# Patient Record
Sex: Male | Born: 1960 | Hispanic: Yes | Marital: Married | Smoking: Never smoker
Health system: Southern US, Community
[De-identification: ages and names within clinical notes are randomized; demographics above are authoritative.]

## PROBLEM LIST (undated history)

## (undated) DIAGNOSIS — I10 Essential (primary) hypertension: Secondary | ICD-10-CM

---

## 2020-09-01 ENCOUNTER — Observation Stay (HOSPITAL_COMMUNITY): Payer: Self-pay

## 2020-09-01 ENCOUNTER — Emergency Department (HOSPITAL_COMMUNITY): Payer: Self-pay

## 2020-09-01 ENCOUNTER — Observation Stay (HOSPITAL_COMMUNITY)
Admission: EM | Admit: 2020-09-01 | Discharge: 2020-09-03 | Disposition: A | Discharge status: Home / Self Care | Payer: Self-pay | Attending: Internal Medicine | Admitting: Internal Medicine

## 2020-09-01 ENCOUNTER — Observation Stay (HOSPITAL_BASED_OUTPATIENT_CLINIC_OR_DEPARTMENT_OTHER): Payer: Self-pay

## 2020-09-01 ENCOUNTER — Other Ambulatory Visit: Payer: Self-pay

## 2020-09-01 ENCOUNTER — Encounter (HOSPITAL_COMMUNITY): Payer: Self-pay | Admitting: Internal Medicine

## 2020-09-01 DIAGNOSIS — I639 Cerebral infarction, unspecified: Secondary | ICD-10-CM

## 2020-09-01 DIAGNOSIS — I10 Essential (primary) hypertension: Secondary | ICD-10-CM

## 2020-09-01 DIAGNOSIS — G459 Transient cerebral ischemic attack, unspecified: Secondary | ICD-10-CM

## 2020-09-01 DIAGNOSIS — Z20822 Contact with and (suspected) exposure to covid-19: Secondary | ICD-10-CM | POA: Insufficient documentation

## 2020-09-01 DIAGNOSIS — E872 Acidosis, unspecified: Secondary | ICD-10-CM

## 2020-09-01 DIAGNOSIS — E7800 Pure hypercholesterolemia, unspecified: Secondary | ICD-10-CM

## 2020-09-01 HISTORY — DX: Essential (primary) hypertension: I10

## 2020-09-01 LAB — LIPID PANEL
Cholesterol: 332 mg/dL — ABNORMAL HIGH (ref 0–200)
HDL: 41 mg/dL (ref >40)
LDL Cholesterol: UNDETERMINED mg/dL (ref 0–99)
Total CHOL/HDL Ratio: 8.1 RATIO
Triglycerides: 790 mg/dL — ABNORMAL HIGH (ref <150)
VLDL: UNDETERMINED mg/dL (ref 0–40)

## 2020-09-01 LAB — COMPREHENSIVE METABOLIC PANEL
ALT: 35 U/L (ref 0–44)
AST: 31 U/L (ref 15–41)
Albumin: 3.8 g/dL (ref 3.5–5.0)
Alkaline Phosphatase: 88 U/L (ref 38–126)
Anion gap: 12 (ref 5–15)
BUN: 14 mg/dL (ref 6–20)
CO2: 19 mmol/L — ABNORMAL LOW (ref 22–32)
Calcium: 8.7 mg/dL — ABNORMAL LOW (ref 8.9–10.3)
Chloride: 105 mmol/L (ref 98–111)
Creatinine, Ser: 0.79 mg/dL (ref 0.61–1.24)
GFR, Estimated: >60 mL/min (ref >60)
Glucose, Bld: 120 mg/dL — ABNORMAL HIGH (ref 70–99)
Potassium: 3.7 mmol/L (ref 3.5–5.1)
Sodium: 136 mmol/L (ref 135–145)
Total Bilirubin: 0.6 mg/dL (ref 0.3–1.2)
Total Protein: 6.9 g/dL (ref 6.5–8.1)

## 2020-09-01 LAB — I-STAT CHEM 8, ED
BUN: 14 mg/dL (ref 6–20)
Calcium, Ion: 1.03 mmol/L — ABNORMAL LOW (ref 1.15–1.40)
Chloride: 105 mmol/L (ref 98–111)
Creatinine, Ser: 1 mg/dL (ref 0.61–1.24)
Glucose, Bld: 124 mg/dL — ABNORMAL HIGH (ref 70–99)
HCT: 51 % (ref 39.0–52.0)
Hemoglobin: 17.3 g/dL — ABNORMAL HIGH (ref 13.0–17.0)
Potassium: 3.6 mmol/L (ref 3.5–5.1)
Sodium: 139 mmol/L (ref 135–145)
TCO2: 21 mmol/L — ABNORMAL LOW (ref 22–32)

## 2020-09-01 LAB — ECHOCARDIOGRAM COMPLETE
AR max vel: 2.88 cm2
AV Area VTI: 2.98 cm2
AV Area mean vel: 2.89 cm2
AV Mean grad: 5 mmHg
AV Peak grad: 8.8 mmHg
Ao pk vel: 1.48 m/s
Area-P 1/2: 4.57 cm2
Calc EF: 63.4 %
Height: 68 in
Single Plane A2C EF: 68.1 %
Single Plane A4C EF: 58.4 %
Weight: 2960 oz

## 2020-09-01 LAB — HIV ANTIBODY (ROUTINE TESTING W REFLEX): HIV Screen 4th Generation wRfx: NONREACTIVE

## 2020-09-01 LAB — RAPID URINE DRUG SCREEN, HOSP PERFORMED
Amphetamines: NOT DETECTED
Barbiturates: NOT DETECTED
Benzodiazepines: NOT DETECTED
Cocaine: NOT DETECTED
Opiates: NOT DETECTED
Tetrahydrocannabinol: NOT DETECTED

## 2020-09-01 LAB — CBC
HCT: 50.9 % (ref 39.0–52.0)
Hemoglobin: 17.2 g/dL — ABNORMAL HIGH (ref 13.0–17.0)
MCH: 30 pg (ref 26.0–34.0)
MCHC: 33.8 g/dL (ref 30.0–36.0)
MCV: 88.8 fL (ref 80.0–100.0)
Platelets: 197 10*3/uL (ref 150–400)
RBC: 5.73 MIL/uL (ref 4.22–5.81)
RDW: 12.6 % (ref 11.5–15.5)
WBC: 8.6 10*3/uL (ref 4.0–10.5)
nRBC: 0 % (ref 0.0–0.2)

## 2020-09-01 LAB — DIFFERENTIAL
Abs Immature Granulocytes: 0.07 10*3/uL (ref 0.00–0.07)
Basophils Absolute: 0.1 10*3/uL (ref 0.0–0.1)
Basophils Relative: 1 %
Eosinophils Absolute: 0.1 10*3/uL (ref 0.0–0.5)
Eosinophils Relative: 1 %
Immature Granulocytes: 1 %
Lymphocytes Relative: 29 %
Lymphs Abs: 2.5 10*3/uL (ref 0.7–4.0)
Monocytes Absolute: 0.7 10*3/uL (ref 0.1–1.0)
Monocytes Relative: 8 %
Neutro Abs: 5.2 10*3/uL (ref 1.7–7.7)
Neutrophils Relative %: 60 %

## 2020-09-01 LAB — PROTIME-INR
INR: 0.9 (ref 0.8–1.2)
Prothrombin Time: 12.4 seconds (ref 11.4–15.2)

## 2020-09-01 LAB — CBG MONITORING, ED: Glucose-Capillary: 131 mg/dL — ABNORMAL HIGH (ref 70–99)

## 2020-09-01 LAB — HEMOGLOBIN A1C
Hgb A1c MFr Bld: 5.4 % (ref 4.8–5.6)
Mean Plasma Glucose: 108.28 mg/dL

## 2020-09-01 LAB — LDL CHOLESTEROL, DIRECT: Direct LDL: 149.9 mg/dL — ABNORMAL HIGH (ref 0–99)

## 2020-09-01 LAB — APTT: aPTT: 26 seconds (ref 24–36)

## 2020-09-01 LAB — SARS CORONAVIRUS 2 (TAT 6-24 HRS): SARS Coronavirus 2: NEGATIVE

## 2020-09-01 MED ORDER — ENOXAPARIN SODIUM 40 MG/0.4ML IJ SOSY
40.0000 mg | PREFILLED_SYRINGE | INTRAMUSCULAR | Status: DC
  Administered 2020-09-01 – 2020-09-03 (×3): 40 mg via SUBCUTANEOUS
  Filled 2020-09-01 (×3): qty 0.4

## 2020-09-01 MED ORDER — ACETAMINOPHEN 650 MG RE SUPP: 650.0000 mg | RECTAL | Status: DC | PRN

## 2020-09-01 MED ORDER — CLOPIDOGREL BISULFATE 75 MG PO TABS
75.0000 mg | ORAL_TABLET | Freq: Every day | ORAL | Status: DC
  Administered 2020-09-02 – 2020-09-03 (×2): 75 mg via ORAL
  Filled 2020-09-01 (×2): qty 1

## 2020-09-01 MED ORDER — STROKE: EARLY STAGES OF RECOVERY BOOK
Freq: Once | Status: AC
  Filled 2020-09-01: qty 1

## 2020-09-01 MED ORDER — CLOPIDOGREL BISULFATE 300 MG PO TABS
300.0000 mg | ORAL_TABLET | Freq: Once | ORAL | Status: AC
  Administered 2020-09-01: 300 mg via ORAL
  Filled 2020-09-01: qty 1

## 2020-09-01 MED ORDER — SODIUM CHLORIDE 0.9 % IV SOLN: INTRAVENOUS | Status: AC

## 2020-09-01 MED ORDER — ACETAMINOPHEN 160 MG/5ML PO SOLN: 650.0000 mg | ORAL | Status: DC | PRN

## 2020-09-01 MED ORDER — ATORVASTATIN CALCIUM 80 MG PO TABS
80.0000 mg | ORAL_TABLET | Freq: Every day | ORAL | Status: DC
  Administered 2020-09-01 – 2020-09-03 (×3): 80 mg via ORAL
  Filled 2020-09-01: qty 2
  Filled 2020-09-01 (×2): qty 1

## 2020-09-01 MED ORDER — ASPIRIN EC 81 MG PO TBEC
81.0000 mg | DELAYED_RELEASE_TABLET | Freq: Every day | ORAL | Status: DC
  Administered 2020-09-01 – 2020-09-03 (×3): 81 mg via ORAL
  Filled 2020-09-01 (×3): qty 1

## 2020-09-01 MED ORDER — IOHEXOL 350 MG/ML SOLN
100.0000 mL | Freq: Once | INTRAVENOUS | Status: AC | PRN
  Administered 2020-09-01: 100 mL via INTRAVENOUS

## 2020-09-01 MED ORDER — SODIUM CHLORIDE 0.9% FLUSH
3.0000 mL | Freq: Once | INTRAVENOUS | Status: AC
Start: 2020-09-01 — End: 2020-09-01
  Administered 2020-09-01: 3 mL via INTRAVENOUS

## 2020-09-01 MED ORDER — ACETAMINOPHEN 325 MG PO TABS: 650.0000 mg | ORAL_TABLET | ORAL | Status: DC | PRN

## 2020-09-01 NOTE — Progress Notes (Signed)
*  PRELIMINARY RESULTS* Echocardiogram 2D Echocardiogram has been performed.  Neomia Dear RDCS 09/01/2020, 2:25 PM

## 2020-09-01 NOTE — ED Provider Notes (Signed)
[facility] EMERGENCY DEPARTMENT Provider Note   CSN: [id number] Arrival date & time: 09/01/20  0301  An emergency department physician performed an initial assessment on this suspected stroke patient at 0303.  History Chief Complaint  Patient presents with   Code Stroke    Nathaniel Hernandez is a 60 y.o. male.  Patient is a 60 year old male with no significant past medical history.  He is brought by EMS for evaluation of strokelike symptoms.  At approximately 2 AM, patient developed right arm and right leg numbness along with inability to speak.  EMS was called and patient was transported here.  Symptoms rapidly improving upon arrival to the ER.  Patient denies any headache or visual disturbances.  Of note is that the patient found out his mother had passed away just hours before the onset of symptoms.  The history is provided by the patient.      No past medical history on file.  There are no problems to display for this patient.        No family history on file.     Home Medications Prior to Admission medications   Not on File    Allergies    Patient has no known allergies.  Review of Systems   Review of Systems  All other systems reviewed and are negative.  Physical Exam Updated Vital Signs BP (!) 150/99 (BP Location: Right Arm)   Pulse 89   Temp 98.2 F (36.8 C) (Oral)   Resp 15   Ht 5\' 8"  (1.727 m)   Wt 83.9 kg   SpO2 99%   BMI 28.13 kg/m   Physical Exam Vitals and nursing note reviewed.  Constitutional:      General: He is not in acute distress.    Appearance: He is well-developed. He is not diaphoretic.  HENT:     Head: Normocephalic and atraumatic.  Eyes:     Extraocular Movements: Extraocular movements intact.     Pupils: Pupils are equal, round, and reactive to light.  Cardiovascular:     Rate and Rhythm: Normal rate and regular rhythm.     Heart sounds: No murmur heard.   No friction rub.  Pulmonary:     Effort:  Pulmonary effort is normal. No respiratory distress.     Breath sounds: Normal breath sounds. No wheezing or rales.  Abdominal:     General: Bowel sounds are normal. There is no distension.     Palpations: Abdomen is soft.     Tenderness: There is no abdominal tenderness.  Musculoskeletal:        General: Normal range of motion.     Cervical back: Normal range of motion and neck supple.  Skin:    General: Skin is warm and dry.  Neurological:     General: No focal deficit present.     Mental Status: He is alert and oriented to person, place, and time.     Cranial Nerves: No cranial nerve deficit.     Sensory: No sensory deficit.     Motor: No weakness.     Coordination: Coordination normal.    ED Results / Procedures / Treatments   Labs (all labs ordered are listed, but only abnormal results are displayed) Labs Reviewed  CBC - Abnormal; Notable for the following components:      Result Value   Hemoglobin 17.2 (*)    All other components within normal limits  CBG MONITORING, ED - Abnormal; Notable for the following components:  Glucose-Capillary 131 (*)    All other components within normal limits  I-STAT CHEM 8, ED - Abnormal; Notable for the following components:   Glucose, Bld 124 (*)    Calcium, Ion 1.03 (*)    TCO2 21 (*)    Hemoglobin 17.3 (*)    All other components within normal limits  PROTIME-INR  APTT  DIFFERENTIAL  COMPREHENSIVE METABOLIC PANEL  CBG MONITORING, ED    EKG EKG Interpretation  Date/Time:  Sunday September 01 2020 03:29:15 EDT Ventricular Rate:  91 PR Interval:  165 QRS Duration: 91 QT Interval:  356 QTC Calculation: 438 R Axis:   49 Text Interpretation: Sinus rhythm Normal ECG Confirmed by Geoffery Lyons ([id number]) on 09/01/2020 3:53:23 AM  Radiology CT HEAD CODE STROKE WO CONTRAST  Result Date: 09/01/2020 CLINICAL DATA:  Code stroke.  Right-sided deficits EXAM: CT HEAD WITHOUT CONTRAST CT ANGIOGRAPHY OF THE HEAD AND NECK TECHNIQUE: Contiguous  axial images were obtained from the base of the skull through the vertex without intravenous contrast. Multidetector CT imaging of the head and neck was performed using the standard protocol during bolus administration of intravenous contrast. Multiplanar CT image reconstructions and MIPs were obtained to evaluate the vascular anatomy. Carotid stenosis measurements (when applicable) are obtained utilizing NASCET criteria, using the distal internal carotid diameter as the denominator. COMPARISON:  None. FINDINGS: CT HEAD FINDINGS Brain: There is no mass, hemorrhage or extra-axial collection. The size and configuration of the ventricles and extra-axial CSF spaces are normal. The brain parenchyma is normal, without evidence of acute or chronic infarction. Vascular: No abnormal hyperdensity of the major intracranial arteries or dural venous sinuses. No intracranial atherosclerosis. Skull: The visualized skull base, calvarium and extracranial soft tissues are normal. Sinuses/Orbits: No fluid levels or advanced mucosal thickening of the visualized paranasal sinuses. No mastoid or middle ear effusion. The orbits are normal. ASPECTS (Alberta Stroke Program Early CT Score) - Ganglionic level infarction (caudate, lentiform nuclei, internal capsule, insula, M1-M3 cortex): 7 - Supraganglionic infarction (M4-M6 cortex): 3 Total score (0-10 with 10 being normal): 10 CTA NECK FINDINGS SKELETON: There is no bony spinal canal stenosis. No lytic or blastic lesion. OTHER NECK: Normal pharynx, larynx and major salivary glands. No cervical lymphadenopathy. Unremarkable thyroid gland. UPPER CHEST: No pneumothorax or pleural effusion. No nodules or masses. AORTIC ARCH: There is no calcific atherosclerosis of the aortic arch. There is no aneurysm, dissection or hemodynamically significant stenosis of the visualized portion of the aorta. Normal variant aortic arch branching pattern with the left vertebral artery arising independently from  the aortic arch. The visualized proximal subclavian arteries are widely patent. RIGHT CAROTID SYSTEM: Normal without aneurysm, dissection or stenosis. LEFT CAROTID SYSTEM: Normal without aneurysm, dissection or stenosis. VERTEBRAL ARTERIES: Left dominant configuration. Both origins are clearly patent. There is no dissection, occlusion or flow-limiting stenosis to the skull base (V1-V3 segments). CTA HEAD FINDINGS POSTERIOR CIRCULATION: --Vertebral arteries: Normal V4 segments. --Inferior cerebellar arteries: Normal. --Basilar artery: Normal. --Superior cerebellar arteries: Normal. --Posterior cerebral arteries (PCA): Normal. ANTERIOR CIRCULATION: --Intracranial internal carotid arteries: Normal. --Anterior cerebral arteries (ACA): Normal. Both A1 segments are present. Patent anterior communicating artery (a-comm). --Middle cerebral arteries (MCA): Normal. VENOUS SINUSES: As permitted by contrast timing, patent. ANATOMIC VARIANTS: Fetal origin of the left posterior cerebral artery. Review of the MIP images confirms the above findings. IMPRESSION: 1. Normal head CT. 2. ASPECTS is 10. 3. No emergent large vessel occlusion. Unremarkable CTA of the head and neck. These results were called by telephone at  the time of interpretation on 09/01/2020 at 3:29 am to Dr. Judd Lienelo, who verbally acknowledged these results. Electronically Signed   By: Deatra RobinsonKevin  Herman M.D.   On: 09/01/2020 03:43   CT ANGIO HEAD NECK W WO CM (CODE STROKE)  Result Date: 09/01/2020 CLINICAL DATA:  Code stroke.  Right-sided deficits EXAM: CT HEAD WITHOUT CONTRAST CT ANGIOGRAPHY OF THE HEAD AND NECK TECHNIQUE: Contiguous axial images were obtained from the base of the skull through the vertex without intravenous contrast. Multidetector CT imaging of the head and neck was performed using the standard protocol during bolus administration of intravenous contrast. Multiplanar CT image reconstructions and MIPs were obtained to evaluate the vascular anatomy. Carotid  stenosis measurements (when applicable) are obtained utilizing NASCET criteria, using the distal internal carotid diameter as the denominator. COMPARISON:  None. FINDINGS: CT HEAD FINDINGS Brain: There is no mass, hemorrhage or extra-axial collection. The size and configuration of the ventricles and extra-axial CSF spaces are normal. The brain parenchyma is normal, without evidence of acute or chronic infarction. Vascular: No abnormal hyperdensity of the major intracranial arteries or dural venous sinuses. No intracranial atherosclerosis. Skull: The visualized skull base, calvarium and extracranial soft tissues are normal. Sinuses/Orbits: No fluid levels or advanced mucosal thickening of the visualized paranasal sinuses. No mastoid or middle ear effusion. The orbits are normal. ASPECTS (Alberta Stroke Program Early CT Score) - Ganglionic level infarction (caudate, lentiform nuclei, internal capsule, insula, M1-M3 cortex): 7 - Supraganglionic infarction (M4-M6 cortex): 3 Total score (0-10 with 10 being normal): 10 CTA NECK FINDINGS SKELETON: There is no bony spinal canal stenosis. No lytic or blastic lesion. OTHER NECK: Normal pharynx, larynx and major salivary glands. No cervical lymphadenopathy. Unremarkable thyroid gland. UPPER CHEST: No pneumothorax or pleural effusion. No nodules or masses. AORTIC ARCH: There is no calcific atherosclerosis of the aortic arch. There is no aneurysm, dissection or hemodynamically significant stenosis of the visualized portion of the aorta. Normal variant aortic arch branching pattern with the left vertebral artery arising independently from the aortic arch. The visualized proximal subclavian arteries are widely patent. RIGHT CAROTID SYSTEM: Normal without aneurysm, dissection or stenosis. LEFT CAROTID SYSTEM: Normal without aneurysm, dissection or stenosis. VERTEBRAL ARTERIES: Left dominant configuration. Both origins are clearly patent. There is no dissection, occlusion or  flow-limiting stenosis to the skull base (V1-V3 segments). CTA HEAD FINDINGS POSTERIOR CIRCULATION: --Vertebral arteries: Normal V4 segments. --Inferior cerebellar arteries: Normal. --Basilar artery: Normal. --Superior cerebellar arteries: Normal. --Posterior cerebral arteries (PCA): Normal. ANTERIOR CIRCULATION: --Intracranial internal carotid arteries: Normal. --Anterior cerebral arteries (ACA): Normal. Both A1 segments are present. Patent anterior communicating artery (a-comm). --Middle cerebral arteries (MCA): Normal. VENOUS SINUSES: As permitted by contrast timing, patent. ANATOMIC VARIANTS: Fetal origin of the left posterior cerebral artery. Review of the MIP images confirms the above findings. IMPRESSION: 1. Normal head CT. 2. ASPECTS is 10. 3. No emergent large vessel occlusion. Unremarkable CTA of the head and neck. These results were called by telephone at the time of interpretation on 09/01/2020 at 3:29 am to Dr. Judd Lienelo, who verbally acknowledged these results. Electronically Signed   By: Deatra RobinsonKevin  Herman M.D.   On: 09/01/2020 03:43    Procedures Procedures   Medications Ordered in ED Medications  sodium chloride flush (NS) 0.9 % injection 3 mL (has no administration in time range)  clopidogrel (PLAVIX) tablet 300 mg (has no administration in time range)  clopidogrel (PLAVIX) tablet 75 mg (has no administration in time range)  aspirin EC tablet 81 mg (has no  administration in time range)  iohexol (OMNIPAQUE) 350 MG/ML injection 100 mL (100 mLs Intravenous Contrast Given 09/01/20 0326)    ED Course  I have reviewed the triage vital signs and the nursing notes.  Pertinent labs & imaging results that were available during my care of the patient were reviewed by me and considered in my medical decision making (see chart for details).    MDM Rules/Calculators/A&P  Patient presenting with complaints of strokelike symptoms as described in the HPI.  Symptoms resolved prior to arrival.  Head CT and  CTA of the head are both negative.  Patient seen immediately upon arrival by neurology.  Dr. Amada Jupiter feels as though admission for TIA work-up is appropriate.  I spoke with Dr. Loney Loh who agrees to admit.  Final Clinical Impression(s) / ED Diagnoses Final diagnoses:  None    Rx / DC Orders ED Discharge Orders     None        Geoffery Lyons, MD 09/01/20 [phone]

## 2020-09-01 NOTE — ED Notes (Signed)
Pt ambulatory to restroom and back to bed. 

## 2020-09-01 NOTE — TOC Initial Note (Addendum)
Transition of Care [facility]) - Initial/Assessment Note    Patient Details  Name: Nathaniel Hernandez MRN: [id number] Date of Birth: 05/21/1960  Transition of Care [facility]) CM/SW Contact:    Lockie Pares, RN Phone Number: 09/01/2020, 1:01 PM  Clinical Narrative:                  Patient seen in the ED for stroke like symptoms resolved, upon arrival. Symptoms started after wife passed away earlier. CT angio WNL, PT and OT evaluated patient, no needs identified. PCP assigned for patient follow up. CM will follow for transitions and needs, Currently has no insurance. May need MATCH for medications       Patient Goals and CMS Choice        Expected Discharge Plan and Services      Discharge to home self care                                          Prior Living Arrangements/Services                       Activities of Daily Living      Permission Sought/Granted                  Emotional Assessment              Admission diagnosis:  TIA (transient ischemic attack) [G45.9] Patient Active Problem List   Diagnosis Date Noted   TIA (transient ischemic attack) 09/01/2020   Metabolic acidosis 09/01/2020   PCP:  Default, Provider, MD Pharmacy:   [id number] [place], [place] - [address] [place] [address] Phone: [phone] Fax: [phone]     Social Determinants of Health (SDOH) Interventions    Readmission Risk Interventions No flowsheet data found.

## 2020-09-01 NOTE — Evaluation (Signed)
Speech Language Pathology Evaluation Patient Details Name: Nathaniel Hernandez MRN: [id number] DOB: Jan 14, 1961 Today's Date: 09/01/2020 Time: 1345-1400 SLP Time Calculation (min) (ACUTE ONLY): 15 min  Problem List:  Patient Active Problem List   Diagnosis Date Noted   TIA (transient ischemic attack) 09/01/2020   Metabolic acidosis 09/01/2020   Past Medical History: History reviewed. No pertinent past medical history. Past Surgical History: History reviewed. No pertinent surgical history. HPI:  Nathaniel Hernandez is a 60 y.o. male with no significant past medical history presenting to the ED via EMS as code stroke for evaluation of acute onset right-sided weakness which started at 2 AM this morning.  When EMS arrived, they found him to be completely flaccid on the right, however, symptoms began to improve in route and completely resolved by the time the patient reached the emergency room.  CT head and CTA head and neck negative. MRI has since been completed and was showing a normal brain.   Assessment / Plan / Recommendation Clinical Impression  Cognitive/liguistic evaluation was completed using portions of the Mini Mental State Exam (MMSE).  Interpreter IPad was used with interperter [id number] in Spanish.  Cranial nerve exam was completed and unremarkable.  Lingual, labial, facial and jaw range of motion and strength were adequate.  Facial sensation appeared to be intact.  He achieved an overall score of 26 out of a possible 27 points on the MMSE suggesting functional cognitve/linguistic skills.  He was oriented to person, place, time and situation.  He had good immediate and delayed recall of three novel words.  Attention to task was good and his langauge skills appeared to be grossly intact.  He was able to name objects, repeat a sentence, and follow a 3 step command.  He was also able to provide logical solutions to simple problems.  The patient was not endorsing any changes to his cognitive and language  skills. His son reported he was at baseline.  Given this ST follow up is not indicated.  If we can be of further assistance please feel free to reconsult.    SLP Assessment  SLP Recommendation/Assessment: Patient does not need any further Speech Lanaguage Pathology Services    Follow Up Recommendations  None          SLP Evaluation Cognition  Overall Cognitive Status: Within Functional Limits for tasks assessed Arousal/Alertness: Awake/alert Orientation Level: Oriented X4 Attention: Focused Focused Attention: Appears intact Memory: Appears intact Awareness: Appears intact Problem Solving: Appears intact Safety/Judgment: Appears intact       Comprehension  Auditory Comprehension Overall Auditory Comprehension: Appears within functional limits for tasks assessed Yes/No Questions: Not tested Commands: Within Functional Limits Conversation: Simple Reading Comprehension Reading Status: Not tested    Expression Verbal Expression Overall Verbal Expression: Appears within functional limits for tasks assessed Initiation: No impairment Automatic Speech: Name;Social Response Level of Generative/Spontaneous Verbalization: Sentence;Conversation Repetition: No impairment Naming: No impairment Pragmatics: No impairment Non-Verbal Means of Communication: Not applicable Written Expression Dominant Hand: Right Written Expression: Within Functional Limits   Oral / Motor  Oral Motor/Sensory Function Overall Oral Motor/Sensory Function: Within functional limits   GO                    Dimas Aguas, MA, CCC-SLP Acute Rehab SLP [phone]  Fleet Contras 09/01/2020, 2:22 PM

## 2020-09-01 NOTE — ED Notes (Signed)
Neuro team at BS

## 2020-09-01 NOTE — Progress Notes (Signed)
PT Cancellation & Discharge Note  Patient Details Name: Nathaniel Hernandez MRN: [id number] DOB: Sep 20, 1960   Cancelled Treatment:    Reason Eval/Treat Not Completed: PT screened, no needs identified, will sign off. Communicated with OT, who reported pt appears to be back to baseline with no deficits identified. PT will sign off.   Raymond Gurney, PT, DPT Acute Rehabilitation Services  Pager: [phone] Office: [phone]    Jewel Baize 09/01/2020, 9:54 AM

## 2020-09-01 NOTE — Progress Notes (Signed)
60 year old without significant past medical history admitted for acute right-sided weakness started at 2 AM on the morning of admission.  CT head, CTA head and neck is negative.  Neurology consulted.  Symptoms resolved by the time he arrived to the ED.  Patient's son stated that patient's mother had passed away few hours prior to admission.  No complaints at this time.   Vital signs are overall unremarkable Constitutional: Not in acute distress Respiratory: Clear to auscultation bilaterally Cardiovascular: Normal sinus rhythm, no rubs Abdomen: Nontender nondistended good bowel sounds Musculoskeletal: No edema noted Skin: No rashes seen Neurologic: CN 2-12 grossly intact.  And nonfocal Psychiatric: Normal judgment and insight. Alert and oriented x 3. Normal mood.     Right-sided weakness concern for TIA versus CVA -CT head, CTA head and neck is negative.  MRI brain without contrast ordered -Neurology consulted - Echocardiogram - On aspirin Plavix -LDL-unable to calculate, A1c 5.4   Stephania Fragmin MD [facility]

## 2020-09-01 NOTE — Evaluation (Signed)
Occupational Therapy Evaluation Patient Details Name: Nathaniel Hernandez MRN: [id number] DOB: 10-20-1960 Today's Date: 09/01/2020    History of Present Illness Pt is a 60 y.o. male who presented 09/01/20 with acute R-sided weakness that improved in route to hospital. CT head and CTA head and neck negative. Undergoing TIA work-up. Per chart, pt's mother had passed away a few hours prior to onset of symptoms. No significant past medical hx.   Clinical Impression   Patient evaluated by Occupational Therapy with no further acute OT needs identified. All education has been completed and the patient has no further questions. Pt appears to be back to baseline.  No deficits identified.   See below for any follow-up Occupational Therapy or equipment needs. OT is signing off. Thank you for this referral.     Follow Up Recommendations  No OT follow up    Equipment Recommendations  None recommended by OT    Recommendations for Other Services       Precautions / Restrictions Precautions Precautions: None      Mobility Bed Mobility Overal bed mobility: Independent                  Transfers Overall transfer level: Independent                    Balance Overall balance assessment: No apparent balance deficits (not formally assessed)                                         ADL either performed or assessed with clinical judgement   ADL Overall ADL's : Independent                                             Vision Baseline Vision/History: Wears glasses Wears Glasses: At all times Patient Visual Report: No change from baseline Vision Assessment?: Yes Eye Alignment: Within Functional Limits Alignment/Gaze Preference: Within Defined Limits Tracking/Visual Pursuits: Able to track stimulus in all quads without difficulty Visual Fields: No apparent deficits     Perception Perception Perception Tested?: Yes   Praxis Praxis Praxis  tested?: Within functional limits    Pertinent Vitals/Pain Pain Assessment: No/denies pain     Hand Dominance Right   Extremity/Trunk Assessment Upper Extremity Assessment Upper Extremity Assessment: Overall WFL for tasks assessed   Lower Extremity Assessment Lower Extremity Assessment: Overall WFL for tasks assessed   Cervical / Trunk Assessment Cervical / Trunk Assessment: Normal   Communication Communication Communication: Prefers language other than English (offered to use interpreter, however, pt requested to have his son interpret)   Cognition Arousal/Alertness: Awake/alert Behavior During Therapy: WFL for tasks assessed/performed Overall Cognitive Status: Within Functional Limits for tasks assessed                                 General Comments:  (grossly assessed.  Son reports he feels pt is at baseline)   General Comments  Pt able to negotiate obstaces, turn, pick up items off floor    Exercises     Shoulder Instructions      Home Living Family/patient expects to be discharged to:: Private residence Living Arrangements: Spouse/significant other Available Help at Discharge: Family Type of Home:  House Home Access: Stairs to enter [employer] of Steps: 2   Home Layout: One level     Bathroom Shower/Tub: Chief Strategy Officer: Standard                Prior Functioning/Environment Level of Independence: Independent        Comments: works Geneticist, molecular.  Fully independent PTA.  Drives        OT Problem List: Decreased activity tolerance      OT Treatment/Interventions:      OT Goals(Current goals can be found in the care plan section) Acute Rehab OT Goals Patient Stated Goal: did not state OT Goal Formulation: All assessment and education complete, DC therapy  OT Frequency:     Barriers to D/C:            Co-evaluation              AM-PAC OT "6 Clicks" Daily Activity     Outcome  Measure Help from another person eating meals?: None Help from another person taking care of personal grooming?: None Help from another person toileting, which includes using toliet, bedpan, or urinal?: None Help from another person bathing (including washing, rinsing, drying)?: None Help from another person to put on and taking off regular upper body clothing?: None Help from another person to put on and taking off regular lower body clothing?: None 6 Click Score: 24   End of Session Nurse Communication: Mobility status  Activity Tolerance: Patient tolerated treatment well Patient left: in bed;with call bell/phone within reach;with family/visitor present  OT Visit Diagnosis: Unsteadiness on feet (R26.81)                Time: 1610-9604 OT Time Calculation (min): 13 min Charges:  OT General Charges $OT Visit: 1 Visit OT Evaluation $OT Eval Low Complexity: 1 Low  Eber Jones., OTR/L Acute Rehabilitation Services Pager [phone] Office [phone]   Jeani Hawking M 09/01/2020, 9:35 AM

## 2020-09-01 NOTE — ED Notes (Signed)
Dr. Rathore at bedside 

## 2020-09-01 NOTE — ED Notes (Signed)
Dr. Delo at bedside. 

## 2020-09-01 NOTE — ED Notes (Signed)
Alert, NAD, calm, ambulatory to b/r, steady gait, "feels good", denies sx , complaints, or changes. Urine sent.

## 2020-09-01 NOTE — H&P (Signed)
History and Physical    Nathaniel Hernandez VFI:[id number] DOB: February 22, 1960 DOA: 09/01/2020  PCP: Default, Provider, MD Patient coming from: Home  Chief Complaint: Right-sided weakness  HPI: Nathaniel Hernandez is a 60 y.o. male with no significant past medical history presenting to the ED via EMS as code stroke for evaluation of acute onset right-sided weakness which started at 2 AM this morning.  When EMS arrived, they found him to be completely flaccid on the right, however, symptoms began to improve in route and completely resolved by the time the patient reached the emergency room.  CT head and CTA head and neck negative. Neurology recommended admission for TIA work-up.   History provided by patient and his son at bedside.  This morning around 2 AM the patient experienced sudden onset weakness in his right arm and leg.  Son did not notice any facial droop but states the patient was mumbling at that time and had difficulty getting words out.  Son states patient's mother passed away just a few hours ago.  Patient denies history of prior stroke or any medical problems.  Denies fevers, cough, shortness of breath, chest pain, nausea, vomiting, abdominal pain, diarrhea, or dysuria.  Review of Systems:  All systems reviewed and apart from history of presenting illness, are negative.  History reviewed. No pertinent past medical history.  History reviewed. No pertinent surgical history.   reports that he has never smoked. He has never used smokeless tobacco. He reports current alcohol use. He reports that he does not use drugs.  No Known Allergies  Family History  Problem Relation Age of Onset   Stroke Father     Prior to Admission medications   Not on File    Physical Exam: Vitals:   09/01/20 0327 09/01/20 0331 09/01/20 0334 09/01/20 0335  BP:   (!) 150/99   Pulse:   89   Resp:   15   Temp: 98.2 F (36.8 C)     TempSrc: Oral     SpO2:  94% 99%   Weight:    83.9 kg  Height:    5\' 8"   (1.727 m)    Physical Exam Constitutional:      General: He is not in acute distress. HENT:     Head: Normocephalic and atraumatic.  Eyes:     Extraocular Movements: Extraocular movements intact.     Conjunctiva/sclera: Conjunctivae normal.  Cardiovascular:     Rate and Rhythm: Normal rate and regular rhythm.     Pulses: Normal pulses.  Pulmonary:     Effort: Pulmonary effort is normal. No respiratory distress.     Breath sounds: Normal breath sounds. No wheezing or rales.  Abdominal:     General: Bowel sounds are normal. There is no distension.     Palpations: Abdomen is soft.     Tenderness: There is no abdominal tenderness.  Musculoskeletal:        General: No swelling or tenderness.     Cervical back: Normal range of motion and neck supple.  Skin:    General: Skin is warm and dry.  Neurological:     General: No focal deficit present.     Mental Status: He is alert and oriented to person, place, and time.     Cranial Nerves: No cranial nerve deficit.     Sensory: No sensory deficit.     Motor: No weakness.     Labs on Admission: I have personally reviewed following labs and imaging studies  CBC:  Recent Labs  Lab 09/01/20 0307 09/01/20 0311  WBC 8.6  --   NEUTROABS 5.2  --   HGB 17.2* 17.3*  HCT 50.9 51.0  MCV 88.8  --   PLT 197  --    Basic Metabolic Panel: Recent Labs  Lab 09/01/20 0307 09/01/20 0311  NA 136 139  K 3.7 3.6  CL 105 105  CO2 19*  --   GLUCOSE 120* 124*  BUN 14 14  CREATININE 0.79 1.00  CALCIUM 8.7*  --    GFR: Estimated Creatinine Clearance: 83.9 mL/min (by C-G formula based on SCr of 1 mg/dL). Liver Function Tests: Recent Labs  Lab 09/01/20 0307  AST 31  ALT 35  ALKPHOS 88  BILITOT 0.6  PROT 6.9  ALBUMIN 3.8   No results for input(s): LIPASE, AMYLASE in the last 168 hours. No results for input(s): AMMONIA in the last 168 hours. Coagulation Profile: Recent Labs  Lab 09/01/20 0307  INR 0.9   Cardiac Enzymes: No  results for input(s): CKTOTAL, CKMB, CKMBINDEX, TROPONINI in the last 168 hours. BNP (last 3 results) No results for input(s): PROBNP in the last 8760 hours. HbA1C: Recent Labs    09/01/20 0307  HGBA1C 5.4   CBG: Recent Labs  Lab 09/01/20 0304  GLUCAP 131*   Lipid Profile: No results for input(s): CHOL, HDL, LDLCALC, TRIG, CHOLHDL, LDLDIRECT in the last 72 hours. Thyroid Function Tests: No results for input(s): TSH, T4TOTAL, FREET4, T3FREE, THYROIDAB in the last 72 hours. Anemia Panel: No results for input(s): VITAMINB12, FOLATE, FERRITIN, TIBC, IRON, RETICCTPCT in the last 72 hours. Urine analysis: No results found for: COLORURINE, APPEARANCEUR, LABSPEC, PHURINE, GLUCOSEU, HGBUR, BILIRUBINUR, KETONESUR, PROTEINUR, UROBILINOGEN, NITRITE, LEUKOCYTESUR  Radiological Exams on Admission: CT HEAD CODE STROKE WO CONTRAST  Result Date: 09/01/2020 CLINICAL DATA:  Code stroke.  Right-sided deficits EXAM: CT HEAD WITHOUT CONTRAST CT ANGIOGRAPHY OF THE HEAD AND NECK TECHNIQUE: Contiguous axial images were obtained from the base of the skull through the vertex without intravenous contrast. Multidetector CT imaging of the head and neck was performed using the standard protocol during bolus administration of intravenous contrast. Multiplanar CT image reconstructions and MIPs were obtained to evaluate the vascular anatomy. Carotid stenosis measurements (when applicable) are obtained utilizing NASCET criteria, using the distal internal carotid diameter as the denominator. COMPARISON:  None. FINDINGS: CT HEAD FINDINGS Brain: There is no mass, hemorrhage or extra-axial collection. The size and configuration of the ventricles and extra-axial CSF spaces are normal. The brain parenchyma is normal, without evidence of acute or chronic infarction. Vascular: No abnormal hyperdensity of the major intracranial arteries or dural venous sinuses. No intracranial atherosclerosis. Skull: The visualized skull base,  calvarium and extracranial soft tissues are normal. Sinuses/Orbits: No fluid levels or advanced mucosal thickening of the visualized paranasal sinuses. No mastoid or middle ear effusion. The orbits are normal. ASPECTS (Alberta Stroke Program Early CT Score) - Ganglionic level infarction (caudate, lentiform nuclei, internal capsule, insula, M1-M3 cortex): 7 - Supraganglionic infarction (M4-M6 cortex): 3 Total score (0-10 with 10 being normal): 10 CTA NECK FINDINGS SKELETON: There is no bony spinal canal stenosis. No lytic or blastic lesion. OTHER NECK: Normal pharynx, larynx and major salivary glands. No cervical lymphadenopathy. Unremarkable thyroid gland. UPPER CHEST: No pneumothorax or pleural effusion. No nodules or masses. AORTIC ARCH: There is no calcific atherosclerosis of the aortic arch. There is no aneurysm, dissection or hemodynamically significant stenosis of the visualized portion of the aorta. Normal variant aortic arch branching pattern  with the left vertebral artery arising independently from the aortic arch. The visualized proximal subclavian arteries are widely patent. RIGHT CAROTID SYSTEM: Normal without aneurysm, dissection or stenosis. LEFT CAROTID SYSTEM: Normal without aneurysm, dissection or stenosis. VERTEBRAL ARTERIES: Left dominant configuration. Both origins are clearly patent. There is no dissection, occlusion or flow-limiting stenosis to the skull base (V1-V3 segments). CTA HEAD FINDINGS POSTERIOR CIRCULATION: --Vertebral arteries: Normal V4 segments. --Inferior cerebellar arteries: Normal. --Basilar artery: Normal. --Superior cerebellar arteries: Normal. --Posterior cerebral arteries (PCA): Normal. ANTERIOR CIRCULATION: --Intracranial internal carotid arteries: Normal. --Anterior cerebral arteries (ACA): Normal. Both A1 segments are present. Patent anterior communicating artery (a-comm). --Middle cerebral arteries (MCA): Normal. VENOUS SINUSES: As permitted by contrast timing, patent.  ANATOMIC VARIANTS: Fetal origin of the left posterior cerebral artery. Review of the MIP images confirms the above findings. IMPRESSION: 1. Normal head CT. 2. ASPECTS is 10. 3. No emergent large vessel occlusion. Unremarkable CTA of the head and neck. These results were called by telephone at the time of interpretation on 09/01/2020 at 3:29 am to Dr. Judd Lienelo, who verbally acknowledged these results. Electronically Signed   By: Deatra RobinsonKevin  Herman M.D.   On: 09/01/2020 03:43   CT ANGIO HEAD NECK W WO CM (CODE STROKE)  Result Date: 09/01/2020 CLINICAL DATA:  Code stroke.  Right-sided deficits EXAM: CT HEAD WITHOUT CONTRAST CT ANGIOGRAPHY OF THE HEAD AND NECK TECHNIQUE: Contiguous axial images were obtained from the base of the skull through the vertex without intravenous contrast. Multidetector CT imaging of the head and neck was performed using the standard protocol during bolus administration of intravenous contrast. Multiplanar CT image reconstructions and MIPs were obtained to evaluate the vascular anatomy. Carotid stenosis measurements (when applicable) are obtained utilizing NASCET criteria, using the distal internal carotid diameter as the denominator. COMPARISON:  None. FINDINGS: CT HEAD FINDINGS Brain: There is no mass, hemorrhage or extra-axial collection. The size and configuration of the ventricles and extra-axial CSF spaces are normal. The brain parenchyma is normal, without evidence of acute or chronic infarction. Vascular: No abnormal hyperdensity of the major intracranial arteries or dural venous sinuses. No intracranial atherosclerosis. Skull: The visualized skull base, calvarium and extracranial soft tissues are normal. Sinuses/Orbits: No fluid levels or advanced mucosal thickening of the visualized paranasal sinuses. No mastoid or middle ear effusion. The orbits are normal. ASPECTS (Alberta Stroke Program Early CT Score) - Ganglionic level infarction (caudate, lentiform nuclei, internal capsule, insula,  M1-M3 cortex): 7 - Supraganglionic infarction (M4-M6 cortex): 3 Total score (0-10 with 10 being normal): 10 CTA NECK FINDINGS SKELETON: There is no bony spinal canal stenosis. No lytic or blastic lesion. OTHER NECK: Normal pharynx, larynx and major salivary glands. No cervical lymphadenopathy. Unremarkable thyroid gland. UPPER CHEST: No pneumothorax or pleural effusion. No nodules or masses. AORTIC ARCH: There is no calcific atherosclerosis of the aortic arch. There is no aneurysm, dissection or hemodynamically significant stenosis of the visualized portion of the aorta. Normal variant aortic arch branching pattern with the left vertebral artery arising independently from the aortic arch. The visualized proximal subclavian arteries are widely patent. RIGHT CAROTID SYSTEM: Normal without aneurysm, dissection or stenosis. LEFT CAROTID SYSTEM: Normal without aneurysm, dissection or stenosis. VERTEBRAL ARTERIES: Left dominant configuration. Both origins are clearly patent. There is no dissection, occlusion or flow-limiting stenosis to the skull base (V1-V3 segments). CTA HEAD FINDINGS POSTERIOR CIRCULATION: --Vertebral arteries: Normal V4 segments. --Inferior cerebellar arteries: Normal. --Basilar artery: Normal. --Superior cerebellar arteries: Normal. --Posterior cerebral arteries (PCA): Normal. ANTERIOR CIRCULATION: --Intracranial internal  carotid arteries: Normal. --Anterior cerebral arteries (ACA): Normal. Both A1 segments are present. Patent anterior communicating artery (a-comm). --Middle cerebral arteries (MCA): Normal. VENOUS SINUSES: As permitted by contrast timing, patent. ANATOMIC VARIANTS: Fetal origin of the left posterior cerebral artery. Review of the MIP images confirms the above findings. IMPRESSION: 1. Normal head CT. 2. ASPECTS is 10. 3. No emergent large vessel occlusion. Unremarkable CTA of the head and neck. These results were called by telephone at the time of interpretation on 09/01/2020 at 3:29 am  to Dr. Judd Lien, who verbally acknowledged these results. Electronically Signed   By: Deatra Robinson M.D.   On: 09/01/2020 03:43    EKG: Independently reviewed.  Sinus rhythm.  Assessment/Plan Principal Problem:   TIA (transient ischemic attack) Active Problems:   Metabolic acidosis   TIA Patient presenting with complaints of transient severe right-sided weakness and difficulty with speech.  Symptoms rapidly improved in route to the hospital and completely resolved by the time of ED arrival.  Head CT and CTA head and neck negative.  Neurology recommended admission for TIA work-up -Telemetry monitoring -MRI of the brain without contrast -Echocardiogram -Hemoglobin A1c, fasting lipid panel -Antiplatelet therapy: Neurology recommending starting aspirin 81 mg daily and Plavix 75 mg daily after 300 mg load. -Frequent neurochecks -PT, OT, speech therapy. -N.p.o. until cleared by bedside swallow evaluation or formal speech evaluation.  Mild normal anion gap metabolic acidosis Bicarb 19, anion gap 12.  Not on any diuretics.  Not endorsing diarrhea. -IV fluid hydration, continue to monitor  DVT prophylaxis: Lovenox Code Status: Full code Family Communication: No family available at this time. Disposition Plan: Status is: Observation  The patient remains OBS appropriate and will d/c before 2 midnights.  Dispo: The patient is from: Home              Anticipated d/c is to: Home              Patient currently is not medically stable to d/c.   Difficult to place patient No  Level of care: Level of care: Telemetry Medical  The medical decision making on this patient was of high complexity and the patient is at high risk for clinical deterioration, therefore this is a level 3 visit.  John Giovanni MD [facility]  If 7PM-7AM, please contact night-coverage [email or web address]  09/01/2020, 4:44 AM

## 2020-09-01 NOTE — Code Documentation (Signed)
Stroke Response Nurse Documentation Code Documentation  Nathaniel Hernandez is a 60 y.o. male arriving to [name] ED via [facility] on 8/7 with no past medical hx. Code stroke was activated by EMS. Patient from home where he was LKW at 0200 and now complaining of right sided weakness and numbness. On No antithrombotic. Stroke team at the bedside on patient arrival. Labs drawn and patient cleared for CT by Dr. Eudelia Bunch. Patient to CT with team. NIHSS 0, see documentation for details and code stroke times. Patient with no deficits on exam. The following imaging was completed:  CT, CTA head and neck.. Patient is not a candidate for tPA due to no fixed neurological deficits.  Bedside handoff with ED RN Alcario Drought.    Rose Fillers  Rapid Response RN

## 2020-09-01 NOTE — ED Triage Notes (Signed)
Pt BIB [facility] from home- code stroke. EMS reports at 0200 family witnessed pt have complete paralysis on the right side of body. EMS pt could not move right side of body at all. Around 5-10 minutes later after pt in EMS truck pt began to move right side of body more. On arrival pt symptoms had completely resolved. Per EMS pt takes no meds.  18LAC VS with EMS  142/84 HR 89 O2 94 CBG 107

## 2020-09-01 NOTE — Consult Note (Signed)
Neurology Consultation Reason for Consult: Stroke Referring Physician: Judd Lien, D  CC: Right-sided weakness  History is obtained from: Patient  HPI: Nathaniel Hernandez is a 60 y.o. male with no significant past medical history who presents with right-sided weakness that was abrupt in onset and lasted for approximately 10 minutes.  He was up with his family around 2 AM when he had abrupt onset right arm and leg weakness.  EMS was called, and on arrival they found him to be completely flaccid on the right however he began improving in transit and by the time of arrival to the emergency department he was back to normal.  They had activated a code stroke prior to him returning to normal and therefore he was evaluated as a code stroke on arrival.   LKW: 2 AM tpa given?: no, resolution of symptoms   ROS: A 14 point ROS was performed and is negative except as noted in the HPI.   Past medical history: None   Family medical history: Father-stroke  Social History: Denies tobacco and drugs, occasional beer on the weekends   Exam: Current vital signs: Temp 98.2 F (36.8 C) (Oral)   Wt 85 kg  Vital signs in last 24 hours: Temp:  [98.2 F (36.8 C)] 98.2 F (36.8 C) (08/07 0327) Weight:  [85 kg] 85 kg (08/07 0300)   Physical Exam  Constitutional: Appears well-developed and well-nourished.  Psych: Affect appropriate to situation Eyes: No scleral injection HENT: No OP obstruction MSK: no joint deformities.  Cardiovascular: Normal rate and regular rhythm.  Respiratory: Effort normal, non-labored breathing GI: Soft.  No distension. There is no tenderness.  Skin: WDI  Neuro: Mental Status: Patient is awake, alert, oriented to person, place, month, year, and situation. Patient is able to give a clear and coherent history. No signs of aphasia or neglect Cranial Nerves: II: Visual Fields are full. Pupils are equal, round, and reactive to light.   III,IV, VI: EOMI without ptosis or  diploplia.  V: Facial sensation is symmetric to temperature VII: Facial movement is symmetric.  VIII: hearing is intact to voice X: Uvula elevates symmetrically XI: Shoulder shrug is symmetric. XII: tongue is midline without atrophy or fasciculations.  Motor: Tone is normal. Bulk is normal. 5/5 strength was present in all four extremities.  Sensory: Sensation is symmetric to light touch and temperature in the arms and legs. Cerebellar: FNF and HKS are intact bilaterally    I have reviewed labs in epic and the results pertinent to this consultation are: Creatinine 1.0  Chem-8 unremarkable  I have reviewed the images obtained: CT/CTA-negative  Impression: 60 year old male with transient severe right-sided weakness consistent with TIA.  I would favor admission for risk factor stratification and modification.   Recommendations: - HgbA1c, fasting lipid panel - MRI of the brain without contrast - Frequent neuro checks - Echocardiogram - Prophylactic therapy-Antiplatelet med: Aspirin 81 mg and Plavix 75 mg daily after 300 mg load - Risk factor modification - Telemetry monitoring - PT consult, OT consult, Speech consult - Stroke team to follow   Ritta Slot, MD [facility] [phone]  If 7pm- 7am, please page neurology on call as listed in AMION.

## 2020-09-01 NOTE — Progress Notes (Addendum)
STROKE TEAM PROGRESS NOTE   ATTENDING NOTE: I reviewed above note and agree with the assessment and plan. Pt was seen and examined.   60 year old male with no [id number] past medical history admitted for right-sided weakness and speech difficulty, lasted about 10 to 15 minutes.  CT no acute finding.  CT head and neck unremarkable.  MRI negative for acute infarct.  EF 55 to 60% however, there is small mobile echodensity on the ventricular aspect of the aortic valve.  TEE recommended.  A1c 5.4, TG 790, LDL 149.9, UDS negative.  Creatinine 1.00, hemoglobin 17.3.  Neuro examination intact, no focal deficit.  Etiology for patient TIA not quite clear, we will follow-up TEE findings.  Repeat CBC in a.m. to evaluate for polycythemia.  Continue aspirin 81 and Plavix 75 DAPT for 3 weeks and then aspirin alone.  Continue Lipitor 80.  We will follow.  For detailed assessment and plan, please refer to above as I have made changes wherever appropriate.   Marvel PlanJindong Chantell Kunkler, MD PhD Stroke Neurology 09/01/2020 6:05 PM    INTERVAL HISTORY No visitors at bedside. Interpretor via call line on ipad used to interpret this session.  Mr. Ardine Engspitia reports his right sided weakness remains resolved. He feels back to normal and is anxious for discharge today. He denies any history of ever having used tobacco. He also denies any previous diagnosis involving blood abnormality/elevated hgb. He does not go to the doctor routinely. We discussed his ongoing stroke work up and plan of care. He is willing to go follow up appointments to continue to address the issues we have found here.  Vitals:   09/01/20 0649 09/01/20 0700 09/01/20 0715 09/01/20 0745  BP:  (!) 151/105 (!) 146/96 (!) 152/102  Pulse: 92 94 92 90  Resp: 16 20 19 14   Temp:      TempSrc:      SpO2: 96% 97% 96% 97%  Weight:      Height:       CBC:  Recent Labs  Lab 09/01/20 0307 09/01/20 0311  WBC 8.6  --   NEUTROABS 5.2  --   HGB 17.2* 17.3*  HCT 50.9 51.0  MCV  88.8  --   PLT 197  --    Basic Metabolic Panel:  Recent Labs  Lab 09/01/20 0307 09/01/20 0311  NA 136 139  K 3.7 3.6  CL 105 105  CO2 19*  --   GLUCOSE 120* 124*  BUN 14 14  CREATININE 0.79 1.00  CALCIUM 8.7*  --    Lipid Panel:  Recent Labs  Lab 09/01/20 0303  CHOL 332*  TRIG 790*  HDL 41  CHOLHDL 8.1  VLDL UNABLE TO CALCULATE IF TRIGLYCERIDE OVER 400 mg/dL  LDLCALC UNABLE TO CALCULATE IF TRIGLYCERIDE OVER 400 mg/dL   [id number]:  Recent Labs  Lab 09/01/20 0307  HGBA1C 5.4   Urine Drug Screen: No results for input(s): LABOPIA, COCAINSCRNUR, LABBENZ, AMPHETMU, THCU, LABBARB in the last 168 hours.  Alcohol Level No results for input(s): ETH in the last 168 hours.  IMAGING past 24 hours CT HEAD CODE STROKE WO CONTRAST  Result Date: 09/01/2020 CLINICAL DATA:  Code stroke.  Right-sided deficits EXAM: CT HEAD WITHOUT CONTRAST CT ANGIOGRAPHY OF THE HEAD AND NECK TECHNIQUE: Contiguous axial images were obtained from the base of the skull through the vertex without intravenous contrast. Multidetector CT imaging of the head and neck was performed using the standard protocol during bolus administration of intravenous contrast. Multiplanar CT image  reconstructions and MIPs were obtained to evaluate the vascular anatomy. Carotid stenosis measurements (when applicable) are obtained utilizing NASCET criteria, using the distal internal carotid diameter as the denominator. COMPARISON:  None. FINDINGS: CT HEAD FINDINGS Brain: There is no mass, hemorrhage or extra-axial collection. The size and configuration of the ventricles and extra-axial CSF spaces are normal. The brain parenchyma is normal, without evidence of acute or chronic infarction. Vascular: No abnormal hyperdensity of the major intracranial arteries or dural venous sinuses. No intracranial atherosclerosis. Skull: The visualized skull base, calvarium and extracranial soft tissues are normal. Sinuses/Orbits: No fluid levels or advanced  mucosal thickening of the visualized paranasal sinuses. No mastoid or middle ear effusion. The orbits are normal. ASPECTS ([place]berta Stroke Program Early CT Score) - Ganglionic level infarction (caudate, lentiform nuclei, internal capsule, insula, M1-M3 cortex): 7 - Supraganglionic infarction (M4-M6 cortex): 3 Total score (0-10 with 10 being normal): 10 CTA NECK FINDINGS SKELETON: There is no bony spinal canal stenosis. No lytic or blastic lesion. OTHER NECK: Normal pharynx, larynx and major salivary glands. No cervical lymphadenopathy. Unremarkable thyroid gland. UPPER CHEST: No pneumothorax or pleural effusion. No nodules or masses. AORTIC ARCH: There is no calcific atherosclerosis of the aortic arch. There is no aneurysm, dissection or hemodynamically significant stenosis of the visualized portion of the aorta. Normal variant aortic arch branching pattern with the left vertebral artery arising independently from the aortic arch. The visualized proximal subclavian arteries are widely patent. RIGHT CAROTID SYSTEM: Normal without aneurysm, dissection or stenosis. LEFT CAROTID SYSTEM: Normal without aneurysm, dissection or stenosis. VERTEBRAL ARTERIES: Left dominant configuration. Both origins are clearly patent. There is no dissection, occlusion or flow-limiting stenosis to the skull base (V1-V3 segments). CTA HEAD FINDINGS POSTERIOR CIRCULATION: --Vertebral arteries: Normal V4 segments. --Inferior cerebellar arteries: Normal. --Basilar artery: Normal. --Superior cerebellar arteries: Normal. --Posterior cerebral arteries (PCA): Normal. ANTERIOR CIRCULATION: --Intracranial internal carotid arteries: Normal. --Anterior cerebral arteries (ACA): Normal. Both A1 segments are present. Patent anterior communicating artery (a-comm). --Middle cerebral arteries (MCA): Normal. VENOUS SINUSES: As permitted by contrast timing, patent. ANATOMIC VARIANTS: Fetal origin of the left posterior cerebral artery. Review of the MIP images  confirms the above findings. IMPRESSION: 1. Normal head CT. 2. ASPECTS is 10. 3. No emergent large vessel occlusion. Unremarkable CTA of the head and neck. These results were called by telephone at the time of interpretation on 09/01/2020 at 3:29 am to Dr. Judd Lien, who verbally acknowledged these results. Electronically Signed   By: Deatra Robinson M.D.   On: 09/01/2020 03:43   CT ANGIO HEAD NECK W WO CM (CODE STROKE)  Result Date: 09/01/2020 CLINICAL DATA:  Code stroke.  Right-sided deficits EXAM: CT HEAD WITHOUT CONTRAST CT ANGIOGRAPHY OF THE HEAD AND NECK TECHNIQUE: Contiguous axial images were obtained from the base of the skull through the vertex without intravenous contrast. Multidetector CT imaging of the head and neck was performed using the standard protocol during bolus administration of intravenous contrast. Multiplanar CT image reconstructions and MIPs were obtained to evaluate the vascular anatomy. Carotid stenosis measurements (when applicable) are obtained utilizing NASCET criteria, using the distal internal carotid diameter as the denominator. COMPARISON:  None. FINDINGS: CT HEAD FINDINGS Brain: There is no mass, hemorrhage or extra-axial collection. The size and configuration of the ventricles and extra-axial CSF spaces are normal. The brain parenchyma is normal, without evidence of acute or chronic infarction. Vascular: No abnormal hyperdensity of the major intracranial arteries or dural venous sinuses. No intracranial atherosclerosis. Skull: The visualized skull base,  calvarium and extracranial soft tissues are normal. Sinuses/Orbits: No fluid levels or advanced mucosal thickening of the visualized paranasal sinuses. No mastoid or middle ear effusion. The orbits are normal. ASPECTS (Alberta Stroke Program Early CT Score) - Ganglionic level infarction (caudate, lentiform nuclei, internal capsule, insula, M1-M3 cortex): 7 - Supraganglionic infarction (M4-M6 cortex): 3 Total score (0-10 with 10 being  normal): 10 CTA NECK FINDINGS SKELETON: There is no bony spinal canal stenosis. No lytic or blastic lesion. OTHER NECK: Normal pharynx, larynx and major salivary glands. No cervical lymphadenopathy. Unremarkable thyroid gland. UPPER CHEST: No pneumothorax or pleural effusion. No nodules or masses. AORTIC ARCH: There is no calcific atherosclerosis of the aortic arch. There is no aneurysm, dissection or hemodynamically significant stenosis of the visualized portion of the aorta. Normal variant aortic arch branching pattern with the left vertebral artery arising independently from the aortic arch. The visualized proximal subclavian arteries are widely patent. RIGHT CAROTID SYSTEM: Normal without aneurysm, dissection or stenosis. LEFT CAROTID SYSTEM: Normal without aneurysm, dissection or stenosis. VERTEBRAL ARTERIES: Left dominant configuration. Both origins are clearly patent. There is no dissection, occlusion or flow-limiting stenosis to the skull base (V1-V3 segments). CTA HEAD FINDINGS POSTERIOR CIRCULATION: --Vertebral arteries: Normal V4 segments. --Inferior cerebellar arteries: Normal. --Basilar artery: Normal. --Superior cerebellar arteries: Normal. --Posterior cerebral arteries (PCA): Normal. ANTERIOR CIRCULATION: --Intracranial internal carotid arteries: Normal. --Anterior cerebral arteries (ACA): Normal. Both A1 segments are present. Patent anterior communicating artery (a-comm). --Middle cerebral arteries (MCA): Normal. VENOUS SINUSES: As permitted by contrast timing, patent. ANATOMIC VARIANTS: Fetal origin of the left posterior cerebral artery. Review of the MIP images confirms the above findings. IMPRESSION: 1. Normal head CT. 2. ASPECTS is 10. 3. No emergent large vessel occlusion. Unremarkable CTA of the head and neck. These results were called by telephone at the time of interpretation on 09/01/2020 at 3:29 am to Dr. Judd Lien, who verbally acknowledged these results. Electronically Signed   By: Deatra Robinson  M.D.   On: 09/01/2020 03:43    PHYSICAL EXAM Constitutional: Appears well-developed and well-nourished. Psych: Affect appropriate to situation Eyes: No scleral injection HENT: No OP obstruction Cardiovascular: Normal rate and regular rhythm. Respiratory: Effort normal, non-labored breathing Skin: WDI   Neuro: Mental Status: Patient is alert, oriented x4 Patient is able to give a clear and coherent history. Speech is clear, fluent and appropriate in context per interpretor.  Cranial Nerves: II: Visual Fields are full. Pupils are equal, round, and reactive to light.   III,IV, VI: EOMI without ptosis or diploplia. V: Facial sensation is symmetric to temperature VII: Facial movement is symmetric. VIII: hearing is intact to voice X: Uvula elevates symmetrically XI: Shoulder shrug is symmetric. XII: tongue is midline without atrophy or fasciculations. Motor: Tone is normal. Bulk is normal. 5/5 strength was present in all four extremities. Sensory: Sensation is symmetric to light touch and temperature in the arms and legs. Cerebellar: FNF and HKS are intact bilaterally  ASSESSMENT/PLAN Nathaniel Hernandez is a 60 y.o. male with no significant past medical history who presents with right-sided weakness and difficulty speaking that was abrupt in onset and lasted for approximately 10 minutes.  He was up with his family around 2 AM when he had abrupt onset right arm and leg weakness.  EMS was called, and on arrival they found him to be completely flaccid on the right however he began improving in transit and by the time of arrival to the emergency department he was back to normal.  They had activated  a code stroke prior to him returning to normal and therefore he was evaluated as a code stroke on arrival.  Stroke like episode/TIA consisting of transient word finding difficulty and severe right sided weakness that lasted approx 10-15 minutes and then resolved.  Code Stroke CT head        No  acute abnormality. ASPECTS 10.    CTA head & neck       No LVO or other acute  MRI  Brain       Normal  2D Echo PENDING LDL direct 149,Triglycerides 790 HgbA1c 5.4 VTE prophylaxis - recommended    Diet   Diet Heart Room service appropriate? Yes; Fluid consistency: Thin   Recommend DAPT x 3 weeks then ASA 81mg  alone  Therapy recommendations:  Cleared without needs  Disposition:  Home   Blood pressure management Home meds:  None, no hx of diagnosed HTN Elevated while in hospital thus far up to 150 systolic and 102 diastolic Permissive hypertension (OK if < 220/120) but gradually normalize in 5-7 days Long-term BP goal normotensive Close PCP follow up  Hyperlipidemia Home meds:  None, new diagnosis  LDL direct 149, goal < 70 High intensity statin: Atoravstatin 80mg  initiated  Continue statin at discharge Close PCP Follow up        Elevated RBCs       ?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 After hours, contact General Neurology

## 2020-09-01 NOTE — ED Notes (Signed)
OT at BS.  

## 2020-09-01 NOTE — ED Notes (Signed)
Pt alert, NAD, calm, interactive, resps e/u, speaking in clear complete sentences, son at [facility]. Pending MRI.

## 2020-09-02 LAB — BASIC METABOLIC PANEL
Anion gap: 8 (ref 5–15)
BUN: 14 mg/dL (ref 6–20)
CO2: 25 mmol/L (ref 22–32)
Calcium: 9 mg/dL (ref 8.9–10.3)
Chloride: 103 mmol/L (ref 98–111)
Creatinine, Ser: 0.83 mg/dL (ref 0.61–1.24)
GFR, Estimated: >60 mL/min (ref >60)
Glucose, Bld: 105 mg/dL — ABNORMAL HIGH (ref 70–99)
Potassium: 4 mmol/L (ref 3.5–5.1)
Sodium: 136 mmol/L (ref 135–145)

## 2020-09-02 LAB — CBC
HCT: 50.2 % (ref 39.0–52.0)
Hemoglobin: 17 g/dL (ref 13.0–17.0)
MCH: 29.8 pg (ref 26.0–34.0)
MCHC: 33.9 g/dL (ref 30.0–36.0)
MCV: 87.9 fL (ref 80.0–100.0)
Platelets: 203 10*3/uL (ref 150–400)
RBC: 5.71 MIL/uL (ref 4.22–5.81)
RDW: 12.7 % (ref 11.5–15.5)
WBC: 8.9 10*3/uL (ref 4.0–10.5)
nRBC: 0 % (ref 0.0–0.2)

## 2020-09-02 LAB — MAGNESIUM: Magnesium: 2.1 mg/dL (ref 1.7–2.4)

## 2020-09-02 MED ORDER — SODIUM CHLORIDE 0.9 % IV SOLN: INTRAVENOUS | Status: DC

## 2020-09-02 NOTE — Progress Notes (Signed)
STROKE TEAM PROGRESS NOTE   ATTENDING NOTE: I reviewed above note and agree with the assessment and plan. Pt was seen and examined.   No acute event overnight, patient sitting in chair, neuro intact.  His son and wife are at bedside.  TEE planned for tomorrow.  Repeat CBC showed hemoglobin 17.0.  UDS negative. Continue aspirin 81 and Plavix 75 DAPT for 3 weeks and then aspirin alone.  Continue Lipitor 80.  We will follow.  For detailed assessment and plan, please refer to above as I have made changes wherever appropriate.   Marvel Plan, MD PhD Stroke Neurology 09/02/2020 7:27 PM    INTERVAL HISTORY No visitors at bedside. Interpretor via call line on ipad used to interpret this session.  Nathaniel Hernandez reports his right sided weakness remains resolved. He feels back to normal and is anxious for discharge today. He denies any history of ever having used tobacco. He also denies any previous diagnosis involving blood abnormality/elevated hgb. He does not go to the doctor routinely. We discussed his ongoing stroke work up and plan of care. He is willing to go follow up appointments to continue to address the issues we have found here.  Vitals:   09/02/20 0428 09/02/20 0725 09/02/20 1150 09/02/20 1718  BP: (!) 152/87 (!) 159/93 (!) 162/105 (!) 167/103  Pulse: 77 84 79 72  Resp: 16 17 17 17   Temp: 97.8 F (36.6 C) 98 F (36.7 C) 98.8 F (37.1 C) 98.3 F (36.8 C)  TempSrc: Oral Oral Oral Oral  SpO2: 98% 95% 97% 96%  Weight:      Height:       CBC:  Recent Labs  Lab 09/01/20 0307 09/01/20 0311 09/02/20 0351  WBC 8.6  --  8.9  NEUTROABS 5.2  --   --   HGB 17.2* 17.3* 17.0  HCT 50.9 51.0 50.2  MCV 88.8  --  87.9  PLT 197  --  203   Basic Metabolic Panel:  Recent Labs  Lab 09/01/20 0307 09/01/20 0311 09/02/20 0351  NA 136 139 136  K 3.7 3.6 4.0  CL 105 105 103  CO2 19*  --  25  GLUCOSE 120* 124* 105*  BUN 14 14 14   CREATININE 0.79 1.00 0.83  CALCIUM 8.7*  --  9.0  MG  --    --  2.1   Lipid Panel:  Recent Labs  Lab 09/01/20 0303  CHOL 332*  TRIG 790*  HDL 41  CHOLHDL 8.1  VLDL UNABLE TO CALCULATE IF TRIGLYCERIDE OVER 400 mg/dL  LDLCALC UNABLE TO CALCULATE IF TRIGLYCERIDE OVER 400 mg/dL   :  Recent Labs  Lab 09/01/20 0307  HGBA1C 5.4   Urine Drug Screen:  Recent Labs  Lab 09/01/20 0940  LABOPIA NONE DETECTED  COCAINSCRNUR NONE DETECTED  LABBENZ NONE DETECTED  AMPHETMU NONE DETECTED  THCU NONE DETECTED  LABBARB NONE DETECTED    Alcohol Level No results for input(s): ETH in the last 168 hours.  IMAGING past 24 hours No results found.  PHYSICAL EXAM Constitutional: Appears well-developed and well-nourished. Psych: Affect appropriate to situation Eyes: No scleral injection HENT: No OP obstruction Cardiovascular: Normal rate and regular rhythm. Respiratory: Effort normal, non-labored breathing Skin: WDI   Neuro: Mental Status: Patient is alert, oriented x4 Patient is able to give a clear and coherent history. Speech is clear, fluent and appropriate in context per interpretor.  Cranial Nerves: II: Visual Fields are full. Pupils are equal, round, and reactive to light.  III,IV, VI: EOMI without ptosis or diploplia. V: Facial sensation is symmetric to temperature VII: Facial movement is symmetric. VIII: hearing is intact to voice X: Uvula elevates symmetrically XI: Shoulder shrug is symmetric. XII: tongue is midline without atrophy or fasciculations. Motor: Tone is normal. Bulk is normal. 5/5 strength was present in all four extremities. Sensory: Sensation is symmetric to light touch and temperature in the arms and legs. Cerebellar: FNF and HKS are intact bilaterally  ASSESSMENT/PLAN Nathaniel Hernandez is a 60 y.o. male with no significant past medical history who presents with right-sided weakness and difficulty speaking that was abrupt in onset and lasted for approximately 10 minutes.  He was up with his family around 2 AM  when he had abrupt onset right arm and leg weakness.  EMS was called, and on arrival they found him to be completely flaccid on the right however he began improving in transit and by the time of arrival to the emergency department he was back to normal.  They had activated a code stroke prior to him returning to normal and therefore he was evaluated as a code stroke on arrival.  Stroke like episode/TIA consisting of transient word finding difficulty and severe right sided weakness that lasted approx 10-15 minutes and then resolved.  Code Stroke CT head        No acute abnormality. ASPECTS 10.    CTA head & neck       No LVO or other acute  MRI  Brain       Normal  2D Echo PENDING LDL direct 149,Triglycerides 790 HgbA1c 5.4 VTE prophylaxis - recommended    Diet   Diet Heart Room service appropriate? Yes; Fluid consistency: Thin   Diet NPO time specified Except for: Sips with Meds   Recommend DAPT x 3 weeks then ASA 81mg  alone  Therapy recommendations:  Cleared without needs  Disposition:  Home   Blood pressure management Home meds:  None, no hx of diagnosed HTN Elevated while in hospital thus far up to 150 systolic and 102 diastolic Permissive hypertension (OK if < 220/120) but gradually normalize in 5-7 days Long-term BP goal normotensive Close PCP follow up  Hyperlipidemia Home meds:  None, new diagnosis  LDL direct 149, goal < 70 High intensity statin: Atoravstatin 80mg  initiated  Continue statin at discharge Close PCP Follow up        Elevated RBCs       ?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 After hours, contact General Neurology

## 2020-09-02 NOTE — Progress Notes (Signed)
PROGRESS NOTE    Nathaniel Hernandez  LFY:[id number] DOB: 1960-03-03 DOA: 09/01/2020 PCP: Default, Provider, MD   Brief Narrative:  60 year old without significant past medical history admitted for acute right-sided weakness started at 2 AM on the morning of admission.  CT head, CTA head and neck is negative.  Neurology consulted.  Symptoms resolved by the time he arrived to the ED.  Patient's son stated that patient's mother had passed away few hours prior to admission.  A1c was 5.4, LDL 149, UDS was negative.  Echo showed EF 55 to 60% with small mobile density on the ventricular aspect of aortic valve.  Cardiology team notified to help obtain TEE.   Assessment & Plan:   Principal Problem:   TIA (transient ischemic attack) Active Problems:   Metabolic acidosis  Right-sided weakness, resolved Transient ischemic attack - CT head, CTA head and neck and MRI brain negative.  A1c 5.4.  LDL 149 - Aspirin Plavix for 3 weeks followed by aspirin.  Lipitor 80 mg - Echo showed EF 55 to 60% with mobile density on the aortic valve - Cardiology notified for TEE. Plans tomorrow. NPO PMN - Seen by neurology team -UDS-negative  Elevated hemoglobin - Concern for polycythemia?.  Follow-up outpatient PCP    DVT prophylaxis: Lovenox Code Status: Full code Family Communication:  Son and wife at bedside   Dispo: The patient is from: Home              Anticipated d/c is to: Home              Patient currently is not medically stable to d/c. Ongoing TIA work up. TEE planned for tomorrow.    Difficult to place patient No     Subjective: Feels ok, no new complaints.   Review of Systems Otherwise negative except as per HPI, including: General: Denies fever, chills, night sweats or unintended weight loss. Resp: Denies cough, wheezing, shortness of breath. Cardiac: Denies chest pain, palpitations, orthopnea, paroxysmal nocturnal dyspnea. GI: Denies abdominal pain, nausea, vomiting, diarrhea or  constipation GU: Denies dysuria, frequency, hesitancy or incontinence MS: Denies muscle aches, joint pain or swelling Neuro: Denies headache, neurologic deficits (focal weakness, numbness, tingling), abnormal gait Psych: Denies anxiety, depression, SI/HI/AVH Skin: Denies new rashes or lesions ID: Denies sick contacts, exotic exposures, travel  Examination:  General exam: Appears calm and comfortable  Respiratory system: Clear to auscultation. Respiratory effort normal. Cardiovascular system: S1 & S2 heard, RRR. No JVD, murmurs, rubs, gallops or clicks. No pedal edema. Gastrointestinal system: Abdomen is nondistended, soft and nontender. No organomegaly or masses felt. Normal bowel sounds heard. Central nervous system: Alert and oriented. No focal neurological deficits. Extremities: Symmetric 5 x 5 power. Skin: No rashes, lesions or ulcers Psychiatry: Judgement and insight appear normal. Mood & affect appropriate.     Objective: Vitals:   09/01/20 2018 09/01/20 2350 09/02/20 0428 09/02/20 0725  BP: (!) 152/93 (!) 158/97 (!) 152/87 (!) 159/93  Pulse: 84 81 77 84  Resp: 20 18 16 17   Temp: 97.7 F (36.5 C) 98.5 F (36.9 C) 97.8 F (36.6 C) 98 F (36.7 C)  TempSrc: Oral Oral Oral Oral  SpO2: 97% 97% 98% 95%  Weight:      Height:        Intake/Output Summary (Last 24 hours) at 09/02/2020 0821 Last data filed at 09/01/2020 1636 Gross per 24 hour  Intake 1200 ml  Output --  Net 1200 ml   Filed Weights   09/01/20 0300  09/01/20 0335  Weight: 85 kg 83.9 kg     Data Reviewed:   CBC: Recent Labs  Lab 09/01/20 0307 09/01/20 0311 09/02/20 0351  WBC 8.6  --  8.9  NEUTROABS 5.2  --   --   HGB 17.2* 17.3* 17.0  HCT 50.9 51.0 50.2  MCV 88.8  --  87.9  PLT 197  --  203   Basic Metabolic Panel: Recent Labs  Lab 09/01/20 0307 09/01/20 0311 09/02/20 0351  NA 136 139 136  K 3.7 3.6 4.0  CL 105 105 103  CO2 19*  --  25  GLUCOSE 120* 124* 105*  BUN CREATININE  0.79 1.00 0.83  CALCIUM 8.7*  --  9.0  MG  --   --  2.1   GFR: Estimated Creatinine Clearance: 101.1 mL/min (by C-G formula based on SCr of 0.83 mg/dL). Liver Function Tests: Recent Labs  Lab 09/01/20 0307  AST 31  ALT 35  ALKPHOS 88  BILITOT 0.6  PROT 6.9  ALBUMIN 3.8   No results for input(s): LIPASE, AMYLASE in the last 168 hours. No results for input(s): AMMONIA in the last 168 hours. Coagulation Profile: Recent Labs  Lab 09/01/20 0307  INR 0.9   Cardiac Enzymes: No results for input(s): CKTOTAL, CKMB, CKMBINDEX, TROPONINI in the last 168 hours. BNP (last 3 results) No results for input(s): PROBNP in the last 8760 hours. HbA1C: Recent Labs    09/01/20 0307  HGBA1C 5.4   CBG: Recent Labs  Lab 09/01/20 0304  GLUCAP 131*   Lipid Profile: Recent Labs    09/01/20 0303  CHOL 332*  HDL 41  LDLCALC UNABLE TO CALCULATE IF TRIGLYCERIDE OVER 400 mg/dL  TRIG 161*  CHOLHDL 8.1  LDLDIRECT 149.9*   Thyroid Function Tests: No results for input(s): TSH, T4TOTAL, FREET4, T3FREE, THYROIDAB in the last 72 hours. Anemia Panel: No results for input(s): VITAMINB12, FOLATE, FERRITIN, TIBC, IRON, RETICCTPCT in the last 72 hours. Sepsis Labs: No results for input(s): PROCALCITON, LATICACIDVEN in the last 168 hours.  Recent Results (from the past 240 hour(s))  SARS CORONAVIRUS 2 (TAT 6-24 HRS) Nasopharyngeal Nasopharyngeal Swab     Status: None   Collection Time: 09/01/20  4:06 AM   Specimen: Nasopharyngeal Swab  Result Value Ref Range Status   SARS Coronavirus 2 NEGATIVE NEGATIVE Final    Comment: (NOTE) SARS-CoV-2 target nucleic acids are NOT DETECTED.  The SARS-CoV-2 RNA is generally detectable in upper and lower respiratory specimens during the acute phase of infection. Negative results do not preclude SARS-CoV-2 infection, do not rule out co-infections with other pathogens, and should not be used as the sole basis for treatment or other patient management  decisions. Negative results must be combined with clinical observations, patient history, and epidemiological information. The expected result is Negative.  Fact Sheet for Patients: HairSlick.no  Fact Sheet for Healthcare Providers: quierodirigir.com  This test is not yet approved or cleared by the [place] FDA and  has been authorized for detection and/or diagnosis of SARS-CoV-2 by FDA under an Emergency Use Authorization (EUA). This EUA will remain  in effect (meaning this test can be used) for the duration of the COVID-19 declaration under Se ction 564(b)(1) of the Act, 21 U.S.C. section 360bbb-3(b)(1), unless the authorization is terminated or revoked sooner.  Performed at [facility], [address] [place], [place] [address]          Radiology Studies: MR BRAIN WO CONTRAST  Result Date: 09/01/2020 CLINICAL DATA:  TIA EXAM: MRI HEAD WITHOUT CONTRAST TECHNIQUE: Multiplanar, multiecho pulse sequences of the brain and surrounding structures were obtained without intravenous contrast. COMPARISON:  Head CT and CTA from yesterday FINDINGS: Brain: No acute infarction, hemorrhage, hydrocephalus, extra-axial collection or mass lesion. Vascular: Normal flow voids. Skull and upper cervical spine: Normal marrow signal. Sinuses/Orbits: Negative. IMPRESSION: Normal brain MRI Electronically Signed   By: Marnee SpringJonathon  Watts M.D.   On: 09/01/2020 09:38   ECHOCARDIOGRAM COMPLETE  Result Date: 09/01/2020    ECHOCARDIOGRAM REPORT   Patient Name:   Nathaniel SosLBERTO Diop Date of Exam: 09/01/2020 Medical Rec #:  [id number]       Height:       68.0 in Accession #:    [id number]      Weight:       185.0 lb Date of Birth:  23-Mar-1960       BSA:          1.977 m Patient Age:    60 years        BP:           1524/102 mmHg Patient Gender: M               HR:           98 bpm. Exam Location:  Inpatient Procedure: 2D Echo, Cardiac Doppler and Color Doppler  Indications:    TIA  History:        Patient has no prior history of Echocardiogram examinations.  Sonographer:    Neomia Dearamara Crown RDCS Referring Phys: [id number] VASUNDHRA RATHORE IMPRESSIONS  1. Cannot exclude small mobile lesion on ventricular aspect of aortic valve vs. artifact from adjacent calcium. See recommendations. The aortic valve is grossly normal. There is mild calcification of the aortic valve. Aortic valve regurgitation is not visualized. Mild aortic valve sclerosis is present, with no evidence of aortic valve stenosis.  2. Left ventricular ejection fraction, by estimation, is 55 to 60%. The left ventricle has normal function. The left ventricle has no regional wall motion abnormalities. There is mild left ventricular hypertrophy. Left ventricular diastolic parameters were normal.  3. Right ventricular systolic function is normal. The right ventricular size is normal. Tricuspid regurgitation signal is inadequate for assessing PA pressure.  4. The mitral valve is normal in structure. No evidence of mitral valve regurgitation. No evidence of mitral stenosis.  5. The inferior vena cava is normal in size with greater than 50% respiratory variability, suggesting right atrial pressure of 3 mmHg. Conclusion(s)/Recommendation(s): Indeterminate small mobile echodensity on the ventricular aspect of aortic valve. A transesophageal echocardiogram can be considered to exclude cardiac source of embolism if clinically indicated. FINDINGS  Left Ventricle: Left ventricular ejection fraction, by estimation, is 55 to 60%. The left ventricle has normal function. The left ventricle has no regional wall motion abnormalities. The left ventricular internal cavity size was normal in size. There is  mild left ventricular hypertrophy. Left ventricular diastolic parameters were normal. Right Ventricle: The right ventricular size is normal. No increase in right ventricular wall thickness. Right ventricular systolic function is normal.  Tricuspid regurgitation signal is inadequate for assessing PA pressure. Left Atrium: Left atrial size was normal in size. Right Atrium: Right atrial size was normal in size. Pericardium: There is no evidence of pericardial effusion. Presence of pericardial fat pad. Mitral Valve: The mitral valve is normal in structure. No evidence of mitral valve regurgitation. No evidence of mitral valve stenosis. Tricuspid Valve: The tricuspid valve is  normal in structure. Tricuspid valve regurgitation is not demonstrated. No evidence of tricuspid stenosis. Aortic Valve: Cannot exclude small mobile lesion on ventricular aspect of aortic valve vs. artifact from adjacent calcium. See recommendations. The aortic valve is grossly normal. There is mild calcification of the aortic valve. Aortic valve regurgitation is not visualized. Mild aortic valve sclerosis is present, with no evidence of aortic valve stenosis. Aortic valve mean gradient measures 5.0 mmHg. Aortic valve peak gradient measures 8.8 mmHg. Aortic valve area, by VTI measures 2.98 cm. Pulmonic Valve: The pulmonic valve was normal in structure. Pulmonic valve regurgitation is not visualized. No evidence of pulmonic stenosis. Aorta: The aortic root is normal in size and structure. Venous: The inferior vena cava is normal in size with greater than 50% respiratory variability, suggesting right atrial pressure of 3 mmHg. IAS/Shunts: No atrial level shunt detected by color flow Doppler.  LEFT VENTRICLE PLAX 2D LVIDd:         3.80 cm     Diastology LV PW:         1.10 cm     LV e' medial:    8.05 cm/s LV IVS:        1.40 cm     LV E/e' medial:  9.9 LV SV:         69          LV e' lateral:   13.80 cm/s LV SV Index:   35          LV E/e' lateral: 5.8 LVOT Area:     3.80 cm  LV Volumes (MOD) LV vol d, MOD A2C: 58.7 ml LV vol d, MOD A4C: 87.2 ml LV vol s, MOD A2C: 18.7 ml LV vol s, MOD A4C: 36.3 ml LV SV MOD A2C:     40.0 ml LV SV MOD A4C:     87.2 ml LV SV MOD BP:      45.0 ml  RIGHT VENTRICLE RV Basal diam:  3.70 cm RV Mid diam:    2.90 cm RV S prime:     21.30 cm/s TAPSE (M-mode): 2.8 cm LEFT ATRIUM             Index       RIGHT ATRIUM           Index LA Vol (A2C):   52.8 ml 26.71 ml/m RA Area:     10.80 cm LA Vol (A4C):   28.6 ml 14.47 ml/m RA Volume:   21.20 ml  10.72 ml/m LA Biplane Vol: 40.0 ml 20.23 ml/m  AORTIC VALVE                   PULMONIC VALVE AV Area (Vmax):    2.88 cm    PV Vmax:       0.82 m/s AV Area (Vmean):   2.89 cm    PV Vmean:      82.000 cm/s AV Area (VTI):     2.98 cm    PV VTI:        0.203 m AV Vmax:           148.00 cm/s PV Peak grad:  2.7 mmHg AV Vmean:          97.700 cm/s PV Mean grad:  3.0 mmHg AV VTI:            0.232 m AV Peak Grad:      8.8 mmHg AV Mean Grad:      5.0 mmHg LVOT  Vmax:         112.00 cm/s LVOT Vmean:        74.400 cm/s LVOT VTI:          0.182 m LVOT/AV VTI ratio: 0.78 MITRAL VALVE MV Area (PHT): 4.57 cm     SHUNTS MV Decel Time: 166 msec     Systemic VTI: 0.18 m MV E velocity: 79.60 cm/s MV A velocity: 100.00 cm/s MV E/A ratio:  0.80 Weston Brass MD Electronically signed by Weston Brass MD Signature Date/Time: 09/01/2020/4:54:30 PM    Final    CT HEAD CODE STROKE WO CONTRAST  Result Date: 09/01/2020 CLINICAL DATA:  Code stroke.  Right-sided deficits EXAM: CT HEAD WITHOUT CONTRAST CT ANGIOGRAPHY OF THE HEAD AND NECK TECHNIQUE: Contiguous axial images were obtained from the base of the skull through the vertex without intravenous contrast. Multidetector CT imaging of the head and neck was performed using the standard protocol during bolus administration of intravenous contrast. Multiplanar CT image reconstructions and MIPs were obtained to evaluate the vascular anatomy. Carotid stenosis measurements (when applicable) are obtained utilizing NASCET criteria, using the distal internal carotid diameter as the denominator. COMPARISON:  None. FINDINGS: CT HEAD FINDINGS Brain: There is no mass, hemorrhage or extra-axial collection.  The size and configuration of the ventricles and extra-axial CSF spaces are normal. The brain parenchyma is normal, without evidence of acute or chronic infarction. Vascular: No abnormal hyperdensity of the major intracranial arteries or dural venous sinuses. No intracranial atherosclerosis. Skull: The visualized skull base, calvarium and extracranial soft tissues are normal. Sinuses/Orbits: No fluid levels or advanced mucosal thickening of the visualized paranasal sinuses. No mastoid or middle ear effusion. The orbits are normal. ASPECTS (Alberta Stroke Program Early CT Score) - Ganglionic level infarction (caudate, lentiform nuclei, internal capsule, insula, M1-M3 cortex): 7 - Supraganglionic infarction (M4-M6 cortex): 3 Total score (0-10 with 10 being normal): 10 CTA NECK FINDINGS SKELETON: There is no bony spinal canal stenosis. No lytic or blastic lesion. OTHER NECK: Normal pharynx, larynx and major salivary glands. No cervical lymphadenopathy. Unremarkable thyroid gland. UPPER CHEST: No pneumothorax or pleural effusion. No nodules or masses. AORTIC ARCH: There is no calcific atherosclerosis of the aortic arch. There is no aneurysm, dissection or hemodynamically significant stenosis of the visualized portion of the aorta. Normal variant aortic arch branching pattern with the left vertebral artery arising independently from the aortic arch. The visualized proximal subclavian arteries are widely patent. RIGHT CAROTID SYSTEM: Normal without aneurysm, dissection or stenosis. LEFT CAROTID SYSTEM: Normal without aneurysm, dissection or stenosis. VERTEBRAL ARTERIES: Left dominant configuration. Both origins are clearly patent. There is no dissection, occlusion or flow-limiting stenosis to the skull base (V1-V3 segments). CTA HEAD FINDINGS POSTERIOR CIRCULATION: --Vertebral arteries: Normal V4 segments. --Inferior cerebellar arteries: Normal. --Basilar artery: Normal. --Superior cerebellar arteries: Normal. --Posterior  cerebral arteries (PCA): Normal. ANTERIOR CIRCULATION: --Intracranial internal carotid arteries: Normal. --Anterior cerebral arteries (ACA): Normal. Both A1 segments are present. Patent anterior communicating artery (a-comm). --Middle cerebral arteries (MCA): Normal. VENOUS SINUSES: As permitted by contrast timing, patent. ANATOMIC VARIANTS: Fetal origin of the left posterior cerebral artery. Review of the MIP images confirms the above findings. IMPRESSION: 1. Normal head CT. 2. ASPECTS is 10. 3. No emergent large vessel occlusion. Unremarkable CTA of the head and neck. These results were called by telephone at the time of interpretation on 09/01/2020 at 3:29 am to Dr. Judd Lien, who verbally acknowledged these results. Electronically Signed   By: Chrisandra Netters.D.  On: 09/01/2020 03:43   CT ANGIO HEAD NECK W WO CM (CODE STROKE)  Result Date: 09/01/2020 CLINICAL DATA:  Code stroke.  Right-sided deficits EXAM: CT HEAD WITHOUT CONTRAST CT ANGIOGRAPHY OF THE HEAD AND NECK TECHNIQUE: Contiguous axial images were obtained from the base of the skull through the vertex without intravenous contrast. Multidetector CT imaging of the head and neck was performed using the standard protocol during bolus administration of intravenous contrast. Multiplanar CT image reconstructions and MIPs were obtained to evaluate the vascular anatomy. Carotid stenosis measurements (when applicable) are obtained utilizing NASCET criteria, using the distal internal carotid diameter as the denominator. COMPARISON:  None. FINDINGS: CT HEAD FINDINGS Brain: There is no mass, hemorrhage or extra-axial collection. The size and configuration of the ventricles and extra-axial CSF spaces are normal. The brain parenchyma is normal, without evidence of acute or chronic infarction. Vascular: No abnormal hyperdensity of the major intracranial arteries or dural venous sinuses. No intracranial atherosclerosis. Skull: The visualized skull base, calvarium and  extracranial soft tissues are normal. Sinuses/Orbits: No fluid levels or advanced mucosal thickening of the visualized paranasal sinuses. No mastoid or middle ear effusion. The orbits are normal. ASPECTS (Alberta Stroke Program Early CT Score) - Ganglionic level infarction (caudate, lentiform nuclei, internal capsule, insula, M1-M3 cortex): 7 - Supraganglionic infarction (M4-M6 cortex): 3 Total score (0-10 with 10 being normal): 10 CTA NECK FINDINGS SKELETON: There is no bony spinal canal stenosis. No lytic or blastic lesion. OTHER NECK: Normal pharynx, larynx and major salivary glands. No cervical lymphadenopathy. Unremarkable thyroid gland. UPPER CHEST: No pneumothorax or pleural effusion. No nodules or masses. AORTIC ARCH: There is no calcific atherosclerosis of the aortic arch. There is no aneurysm, dissection or hemodynamically significant stenosis of the visualized portion of the aorta. Normal variant aortic arch branching pattern with the left vertebral artery arising independently from the aortic arch. The visualized proximal subclavian arteries are widely patent. RIGHT CAROTID SYSTEM: Normal without aneurysm, dissection or stenosis. LEFT CAROTID SYSTEM: Normal without aneurysm, dissection or stenosis. VERTEBRAL ARTERIES: Left dominant configuration. Both origins are clearly patent. There is no dissection, occlusion or flow-limiting stenosis to the skull base (V1-V3 segments). CTA HEAD FINDINGS POSTERIOR CIRCULATION: --Vertebral arteries: Normal V4 segments. --Inferior cerebellar arteries: Normal. --Basilar artery: Normal. --Superior cerebellar arteries: Normal. --Posterior cerebral arteries (PCA): Normal. ANTERIOR CIRCULATION: --Intracranial internal carotid arteries: Normal. --Anterior cerebral arteries (ACA): Normal. Both A1 segments are present. Patent anterior communicating artery (a-comm). --Middle cerebral arteries (MCA): Normal. VENOUS SINUSES: As permitted by contrast timing, patent. ANATOMIC  VARIANTS: Fetal origin of the left posterior cerebral artery. Review of the MIP images confirms the above findings. IMPRESSION: 1. Normal head CT. 2. ASPECTS is 10. 3. No emergent large vessel occlusion. Unremarkable CTA of the head and neck. These results were called by telephone at the time of interpretation on 09/01/2020 at 3:29 am to Dr. Judd Lien, who verbally acknowledged these results. Electronically Signed   By: Deatra Robinson M.D.   On: 09/01/2020 03:43        Scheduled Meds:  aspirin EC  81 mg Oral Daily   atorvastatin  80 mg Oral Daily   clopidogrel  75 mg Oral Daily   enoxaparin (LOVENOX) injection  40 mg Subcutaneous Q24H   Continuous Infusions:   LOS: 0 days   Time spent= 35 mins    Tashiya Souders Joline Maxcy, MD [facility]  If 7PM-7AM, please contact night-coverage  09/02/2020, 8:21 AM

## 2020-09-02 NOTE — H&P (View-Only) (Signed)
PROGRESS NOTE    Nathaniel Hernandez  LFY:[id number] DOB: 1960-03-03 DOA: 09/01/2020 PCP: Default, Provider, MD   Brief Narrative:  60 year old without significant past medical history admitted for acute right-sided weakness started at 2 AM on the morning of admission.  CT head, CTA head and neck is negative.  Neurology consulted.  Symptoms resolved by the time he arrived to the ED.  Patient's son stated that patient's mother had passed away few hours prior to admission.  A1c was 5.4, LDL 149, UDS was negative.  Echo showed EF 55 to 60% with small mobile density on the ventricular aspect of aortic valve.  Cardiology team notified to help obtain TEE.   Assessment & Plan:   Principal Problem:   TIA (transient ischemic attack) Active Problems:   Metabolic acidosis  Right-sided weakness, resolved Transient ischemic attack - CT head, CTA head and neck and MRI brain negative.  A1c 5.4.  LDL 149 - Aspirin Plavix for 3 weeks followed by aspirin.  Lipitor 80 mg - Echo showed EF 55 to 60% with mobile density on the aortic valve - Cardiology notified for TEE. Plans tomorrow. NPO PMN - Seen by neurology team -UDS-negative  Elevated hemoglobin - Concern for polycythemia?.  Follow-up outpatient PCP    DVT prophylaxis: Lovenox Code Status: Full code Family Communication:  Son and wife at bedside   Dispo: The patient is from: Home              Anticipated d/c is to: Home              Patient currently is not medically stable to d/c. Ongoing TIA work up. TEE planned for tomorrow.    Difficult to place patient No     Subjective: Feels ok, no new complaints.   Review of Systems Otherwise negative except as per HPI, including: General: Denies fever, chills, night sweats or unintended weight loss. Resp: Denies cough, wheezing, shortness of breath. Cardiac: Denies chest pain, palpitations, orthopnea, paroxysmal nocturnal dyspnea. GI: Denies abdominal pain, nausea, vomiting, diarrhea or  constipation GU: Denies dysuria, frequency, hesitancy or incontinence MS: Denies muscle aches, joint pain or swelling Neuro: Denies headache, neurologic deficits (focal weakness, numbness, tingling), abnormal gait Psych: Denies anxiety, depression, SI/HI/AVH Skin: Denies new rashes or lesions ID: Denies sick contacts, exotic exposures, travel  Examination:  General exam: Appears calm and comfortable  Respiratory system: Clear to auscultation. Respiratory effort normal. Cardiovascular system: S1 & S2 heard, RRR. No JVD, murmurs, rubs, gallops or clicks. No pedal edema. Gastrointestinal system: Abdomen is nondistended, soft and nontender. No organomegaly or masses felt. Normal bowel sounds heard. Central nervous system: Alert and oriented. No focal neurological deficits. Extremities: Symmetric 5 x 5 power. Skin: No rashes, lesions or ulcers Psychiatry: Judgement and insight appear normal. Mood & affect appropriate.     Objective: Vitals:   09/01/20 2018 09/01/20 2350 09/02/20 0428 09/02/20 0725  BP: (!) 152/93 (!) 158/97 (!) 152/87 (!) 159/93  Pulse: 84 81 77 84  Resp: 20 18 16 17   Temp: 97.7 F (36.5 C) 98.5 F (36.9 C) 97.8 F (36.6 C) 98 F (36.7 C)  TempSrc: Oral Oral Oral Oral  SpO2: 97% 97% 98% 95%  Weight:      Height:        Intake/Output Summary (Last 24 hours) at 09/02/2020 0821 Last data filed at 09/01/2020 1636 Gross per 24 hour  Intake 1200 ml  Output --  Net 1200 ml   Filed Weights   09/01/20 0300  09/01/20 0335  Weight: 85 kg 83.9 kg     Data Reviewed:   CBC: Recent Labs  Lab 09/01/20 0307 09/01/20 0311 09/02/20 0351  WBC 8.6  --  8.9  NEUTROABS 5.2  --   --   HGB 17.2* 17.3* 17.0  HCT 50.9 51.0 50.2  MCV 88.8  --  87.9  PLT 197  --  203   Basic Metabolic Panel: Recent Labs  Lab 09/01/20 0307 09/01/20 0311 09/02/20 0351  NA 136 139 136  K 3.7 3.6 4.0  CL 105 105 103  CO2 19*  --  25  GLUCOSE 120* 124* 105*  BUN CREATININE  0.79 1.00 0.83  CALCIUM 8.7*  --  9.0  MG  --   --  2.1   GFR: Estimated Creatinine Clearance: 101.1 mL/min (by C-G formula based on SCr of 0.83 mg/dL). Liver Function Tests: Recent Labs  Lab 09/01/20 0307  AST 31  ALT 35  ALKPHOS 88  BILITOT 0.6  PROT 6.9  ALBUMIN 3.8   No results for input(s): LIPASE, AMYLASE in the last 168 hours. No results for input(s): AMMONIA in the last 168 hours. Coagulation Profile: Recent Labs  Lab 09/01/20 0307  INR 0.9   Cardiac Enzymes: No results for input(s): CKTOTAL, CKMB, CKMBINDEX, TROPONINI in the last 168 hours. BNP (last 3 results) No results for input(s): PROBNP in the last 8760 hours. HbA1C: Recent Labs    09/01/20 0307  HGBA1C 5.4   CBG: Recent Labs  Lab 09/01/20 0304  GLUCAP 131*   Lipid Profile: Recent Labs    09/01/20 0303  CHOL 332*  HDL 41  LDLCALC UNABLE TO CALCULATE IF TRIGLYCERIDE OVER 400 mg/dL  TRIG 161*  CHOLHDL 8.1  LDLDIRECT 149.9*   Thyroid Function Tests: No results for input(s): TSH, T4TOTAL, FREET4, T3FREE, THYROIDAB in the last 72 hours. Anemia Panel: No results for input(s): VITAMINB12, FOLATE, FERRITIN, TIBC, IRON, RETICCTPCT in the last 72 hours. Sepsis Labs: No results for input(s): PROCALCITON, LATICACIDVEN in the last 168 hours.  Recent Results (from the past 240 hour(s))  SARS CORONAVIRUS 2 (TAT 6-24 HRS) Nasopharyngeal Nasopharyngeal Swab     Status: None   Collection Time: 09/01/20  4:06 AM   Specimen: Nasopharyngeal Swab  Result Value Ref Range Status   SARS Coronavirus 2 NEGATIVE NEGATIVE Final    Comment: (NOTE) SARS-CoV-2 target nucleic acids are NOT DETECTED.  The SARS-CoV-2 RNA is generally detectable in upper and lower respiratory specimens during the acute phase of infection. Negative results do not preclude SARS-CoV-2 infection, do not rule out co-infections with other pathogens, and should not be used as the sole basis for treatment or other patient management  decisions. Negative results must be combined with clinical observations, patient history, and epidemiological information. The expected result is Negative.  Fact Sheet for Patients: HairSlick.no  Fact Sheet for Healthcare Providers: quierodirigir.com  This test is not yet approved or cleared by the [place] FDA and  has been authorized for detection and/or diagnosis of SARS-CoV-2 by FDA under an Emergency Use Authorization (EUA). This EUA will remain  in effect (meaning this test can be used) for the duration of the COVID-19 declaration under Se ction 564(b)(1) of the Act, 21 U.S.C. section 360bbb-3(b)(1), unless the authorization is terminated or revoked sooner.  Performed at [facility], [address] [place], [place] [address]          Radiology Studies: MR BRAIN WO CONTRAST  Result Date: 09/01/2020 CLINICAL DATA:  TIA EXAM: MRI HEAD WITHOUT CONTRAST TECHNIQUE: Multiplanar, multiecho pulse sequences of the brain and surrounding structures were obtained without intravenous contrast. COMPARISON:  Head CT and CTA from yesterday FINDINGS: Brain: No acute infarction, hemorrhage, hydrocephalus, extra-axial collection or mass lesion. Vascular: Normal flow voids. Skull and upper cervical spine: Normal marrow signal. Sinuses/Orbits: Negative. IMPRESSION: Normal brain MRI Electronically Signed   By: Marnee SpringJonathon  Watts M.D.   On: 09/01/2020 09:38   ECHOCARDIOGRAM COMPLETE  Result Date: 09/01/2020    ECHOCARDIOGRAM REPORT   Patient Name:   Nathaniel SosLBERTO Diop Date of Exam: 09/01/2020 Medical Rec #:  [id number]       Height:       68.0 in Accession #:    [id number]      Weight:       185.0 lb Date of Birth:  23-Mar-1960       BSA:          1.977 m Patient Age:    60 years        BP:           1524/102 mmHg Patient Gender: M               HR:           98 bpm. Exam Location:  Inpatient Procedure: 2D Echo, Cardiac Doppler and Color Doppler  Indications:    TIA  History:        Patient has no prior history of Echocardiogram examinations.  Sonographer:    Neomia Dearamara Crown RDCS Referring Phys: [id number] VASUNDHRA RATHORE IMPRESSIONS  1. Cannot exclude small mobile lesion on ventricular aspect of aortic valve vs. artifact from adjacent calcium. See recommendations. The aortic valve is grossly normal. There is mild calcification of the aortic valve. Aortic valve regurgitation is not visualized. Mild aortic valve sclerosis is present, with no evidence of aortic valve stenosis.  2. Left ventricular ejection fraction, by estimation, is 55 to 60%. The left ventricle has normal function. The left ventricle has no regional wall motion abnormalities. There is mild left ventricular hypertrophy. Left ventricular diastolic parameters were normal.  3. Right ventricular systolic function is normal. The right ventricular size is normal. Tricuspid regurgitation signal is inadequate for assessing PA pressure.  4. The mitral valve is normal in structure. No evidence of mitral valve regurgitation. No evidence of mitral stenosis.  5. The inferior vena cava is normal in size with greater than 50% respiratory variability, suggesting right atrial pressure of 3 mmHg. Conclusion(s)/Recommendation(s): Indeterminate small mobile echodensity on the ventricular aspect of aortic valve. A transesophageal echocardiogram can be considered to exclude cardiac source of embolism if clinically indicated. FINDINGS  Left Ventricle: Left ventricular ejection fraction, by estimation, is 55 to 60%. The left ventricle has normal function. The left ventricle has no regional wall motion abnormalities. The left ventricular internal cavity size was normal in size. There is  mild left ventricular hypertrophy. Left ventricular diastolic parameters were normal. Right Ventricle: The right ventricular size is normal. No increase in right ventricular wall thickness. Right ventricular systolic function is normal.  Tricuspid regurgitation signal is inadequate for assessing PA pressure. Left Atrium: Left atrial size was normal in size. Right Atrium: Right atrial size was normal in size. Pericardium: There is no evidence of pericardial effusion. Presence of pericardial fat pad. Mitral Valve: The mitral valve is normal in structure. No evidence of mitral valve regurgitation. No evidence of mitral valve stenosis. Tricuspid Valve: The tricuspid valve is  normal in structure. Tricuspid valve regurgitation is not demonstrated. No evidence of tricuspid stenosis. Aortic Valve: Cannot exclude small mobile lesion on ventricular aspect of aortic valve vs. artifact from adjacent calcium. See recommendations. The aortic valve is grossly normal. There is mild calcification of the aortic valve. Aortic valve regurgitation is not visualized. Mild aortic valve sclerosis is present, with no evidence of aortic valve stenosis. Aortic valve mean gradient measures 5.0 mmHg. Aortic valve peak gradient measures 8.8 mmHg. Aortic valve area, by VTI measures 2.98 cm. Pulmonic Valve: The pulmonic valve was normal in structure. Pulmonic valve regurgitation is not visualized. No evidence of pulmonic stenosis. Aorta: The aortic root is normal in size and structure. Venous: The inferior vena cava is normal in size with greater than 50% respiratory variability, suggesting right atrial pressure of 3 mmHg. IAS/Shunts: No atrial level shunt detected by color flow Doppler.  LEFT VENTRICLE PLAX 2D LVIDd:         3.80 cm     Diastology LV PW:         1.10 cm     LV e' medial:    8.05 cm/s LV IVS:        1.40 cm     LV E/e' medial:  9.9 LV SV:         69          LV e' lateral:   13.80 cm/s LV SV Index:   35          LV E/e' lateral: 5.8 LVOT Area:     3.80 cm  LV Volumes (MOD) LV vol d, MOD A2C: 58.7 ml LV vol d, MOD A4C: 87.2 ml LV vol s, MOD A2C: 18.7 ml LV vol s, MOD A4C: 36.3 ml LV SV MOD A2C:     40.0 ml LV SV MOD A4C:     87.2 ml LV SV MOD BP:      45.0 ml  RIGHT VENTRICLE RV Basal diam:  3.70 cm RV Mid diam:    2.90 cm RV S prime:     21.30 cm/s TAPSE (M-mode): 2.8 cm LEFT ATRIUM             Index       RIGHT ATRIUM           Index LA Vol (A2C):   52.8 ml 26.71 ml/m RA Area:     10.80 cm LA Vol (A4C):   28.6 ml 14.47 ml/m RA Volume:   21.20 ml  10.72 ml/m LA Biplane Vol: 40.0 ml 20.23 ml/m  AORTIC VALVE                   PULMONIC VALVE AV Area (Vmax):    2.88 cm    PV Vmax:       0.82 m/s AV Area (Vmean):   2.89 cm    PV Vmean:      82.000 cm/s AV Area (VTI):     2.98 cm    PV VTI:        0.203 m AV Vmax:           148.00 cm/s PV Peak grad:  2.7 mmHg AV Vmean:          97.700 cm/s PV Mean grad:  3.0 mmHg AV VTI:            0.232 m AV Peak Grad:      8.8 mmHg AV Mean Grad:      5.0 mmHg LVOT   Vmax:         112.00 cm/s LVOT Vmean:        74.400 cm/s LVOT VTI:          0.182 m LVOT/AV VTI ratio: 0.78 MITRAL VALVE MV Area (PHT): 4.57 cm     SHUNTS MV Decel Time: 166 msec     Systemic VTI: 0.18 m MV E velocity: 79.60 cm/s MV A velocity: 100.00 cm/s MV E/A ratio:  0.80 Weston Brass MD Electronically signed by Weston Brass MD Signature Date/Time: 09/01/2020/4:54:30 PM    Final    CT HEAD CODE STROKE WO CONTRAST  Result Date: 09/01/2020 CLINICAL DATA:  Code stroke.  Right-sided deficits EXAM: CT HEAD WITHOUT CONTRAST CT ANGIOGRAPHY OF THE HEAD AND NECK TECHNIQUE: Contiguous axial images were obtained from the base of the skull through the vertex without intravenous contrast. Multidetector CT imaging of the head and neck was performed using the standard protocol during bolus administration of intravenous contrast. Multiplanar CT image reconstructions and MIPs were obtained to evaluate the vascular anatomy. Carotid stenosis measurements (when applicable) are obtained utilizing NASCET criteria, using the distal internal carotid diameter as the denominator. COMPARISON:  None. FINDINGS: CT HEAD FINDINGS Brain: There is no mass, hemorrhage or extra-axial collection.  The size and configuration of the ventricles and extra-axial CSF spaces are normal. The brain parenchyma is normal, without evidence of acute or chronic infarction. Vascular: No abnormal hyperdensity of the major intracranial arteries or dural venous sinuses. No intracranial atherosclerosis. Skull: The visualized skull base, calvarium and extracranial soft tissues are normal. Sinuses/Orbits: No fluid levels or advanced mucosal thickening of the visualized paranasal sinuses. No mastoid or middle ear effusion. The orbits are normal. ASPECTS (Alberta Stroke Program Early CT Score) - Ganglionic level infarction (caudate, lentiform nuclei, internal capsule, insula, M1-M3 cortex): 7 - Supraganglionic infarction (M4-M6 cortex): 3 Total score (0-10 with 10 being normal): 10 CTA NECK FINDINGS SKELETON: There is no bony spinal canal stenosis. No lytic or blastic lesion. OTHER NECK: Normal pharynx, larynx and major salivary glands. No cervical lymphadenopathy. Unremarkable thyroid gland. UPPER CHEST: No pneumothorax or pleural effusion. No nodules or masses. AORTIC ARCH: There is no calcific atherosclerosis of the aortic arch. There is no aneurysm, dissection or hemodynamically significant stenosis of the visualized portion of the aorta. Normal variant aortic arch branching pattern with the left vertebral artery arising independently from the aortic arch. The visualized proximal subclavian arteries are widely patent. RIGHT CAROTID SYSTEM: Normal without aneurysm, dissection or stenosis. LEFT CAROTID SYSTEM: Normal without aneurysm, dissection or stenosis. VERTEBRAL ARTERIES: Left dominant configuration. Both origins are clearly patent. There is no dissection, occlusion or flow-limiting stenosis to the skull base (V1-V3 segments). CTA HEAD FINDINGS POSTERIOR CIRCULATION: --Vertebral arteries: Normal V4 segments. --Inferior cerebellar arteries: Normal. --Basilar artery: Normal. --Superior cerebellar arteries: Normal. --Posterior  cerebral arteries (PCA): Normal. ANTERIOR CIRCULATION: --Intracranial internal carotid arteries: Normal. --Anterior cerebral arteries (ACA): Normal. Both A1 segments are present. Patent anterior communicating artery (a-comm). --Middle cerebral arteries (MCA): Normal. VENOUS SINUSES: As permitted by contrast timing, patent. ANATOMIC VARIANTS: Fetal origin of the left posterior cerebral artery. Review of the MIP images confirms the above findings. IMPRESSION: 1. Normal head CT. 2. ASPECTS is 10. 3. No emergent large vessel occlusion. Unremarkable CTA of the head and neck. These results were called by telephone at the time of interpretation on 09/01/2020 at 3:29 am to Dr. Judd Lien, who verbally acknowledged these results. Electronically Signed   By: Chrisandra Netters.D.  On: 09/01/2020 03:43   CT ANGIO HEAD NECK W WO CM (CODE STROKE)  Result Date: 09/01/2020 CLINICAL DATA:  Code stroke.  Right-sided deficits EXAM: CT HEAD WITHOUT CONTRAST CT ANGIOGRAPHY OF THE HEAD AND NECK TECHNIQUE: Contiguous axial images were obtained from the base of the skull through the vertex without intravenous contrast. Multidetector CT imaging of the head and neck was performed using the standard protocol during bolus administration of intravenous contrast. Multiplanar CT image reconstructions and MIPs were obtained to evaluate the vascular anatomy. Carotid stenosis measurements (when applicable) are obtained utilizing NASCET criteria, using the distal internal carotid diameter as the denominator. COMPARISON:  None. FINDINGS: CT HEAD FINDINGS Brain: There is no mass, hemorrhage or extra-axial collection. The size and configuration of the ventricles and extra-axial CSF spaces are normal. The brain parenchyma is normal, without evidence of acute or chronic infarction. Vascular: No abnormal hyperdensity of the major intracranial arteries or dural venous sinuses. No intracranial atherosclerosis. Skull: The visualized skull base, calvarium and  extracranial soft tissues are normal. Sinuses/Orbits: No fluid levels or advanced mucosal thickening of the visualized paranasal sinuses. No mastoid or middle ear effusion. The orbits are normal. ASPECTS (Alberta Stroke Program Early CT Score) - Ganglionic level infarction (caudate, lentiform nuclei, internal capsule, insula, M1-M3 cortex): 7 - Supraganglionic infarction (M4-M6 cortex): 3 Total score (0-10 with 10 being normal): 10 CTA NECK FINDINGS SKELETON: There is no bony spinal canal stenosis. No lytic or blastic lesion. OTHER NECK: Normal pharynx, larynx and major salivary glands. No cervical lymphadenopathy. Unremarkable thyroid gland. UPPER CHEST: No pneumothorax or pleural effusion. No nodules or masses. AORTIC ARCH: There is no calcific atherosclerosis of the aortic arch. There is no aneurysm, dissection or hemodynamically significant stenosis of the visualized portion of the aorta. Normal variant aortic arch branching pattern with the left vertebral artery arising independently from the aortic arch. The visualized proximal subclavian arteries are widely patent. RIGHT CAROTID SYSTEM: Normal without aneurysm, dissection or stenosis. LEFT CAROTID SYSTEM: Normal without aneurysm, dissection or stenosis. VERTEBRAL ARTERIES: Left dominant configuration. Both origins are clearly patent. There is no dissection, occlusion or flow-limiting stenosis to the skull base (V1-V3 segments). CTA HEAD FINDINGS POSTERIOR CIRCULATION: --Vertebral arteries: Normal V4 segments. --Inferior cerebellar arteries: Normal. --Basilar artery: Normal. --Superior cerebellar arteries: Normal. --Posterior cerebral arteries (PCA): Normal. ANTERIOR CIRCULATION: --Intracranial internal carotid arteries: Normal. --Anterior cerebral arteries (ACA): Normal. Both A1 segments are present. Patent anterior communicating artery (a-comm). --Middle cerebral arteries (MCA): Normal. VENOUS SINUSES: As permitted by contrast timing, patent. ANATOMIC  VARIANTS: Fetal origin of the left posterior cerebral artery. Review of the MIP images confirms the above findings. IMPRESSION: 1. Normal head CT. 2. ASPECTS is 10. 3. No emergent large vessel occlusion. Unremarkable CTA of the head and neck. These results were called by telephone at the time of interpretation on 09/01/2020 at 3:29 am to Dr. Judd Lien, who verbally acknowledged these results. Electronically Signed   By: Deatra Robinson M.D.   On: 09/01/2020 03:43        Scheduled Meds:  aspirin EC  81 mg Oral Daily   atorvastatin  80 mg Oral Daily   clopidogrel  75 mg Oral Daily   enoxaparin (LOVENOX) injection  40 mg Subcutaneous Q24H   Continuous Infusions:   LOS: 0 days   Time spent= 35 mins    Kermitt Harjo Joline Maxcy, MD [facility]  If 7PM-7AM, please contact night-coverage  09/02/2020, 8:21 AM

## 2020-09-02 NOTE — Progress Notes (Signed)
    [facility] has been requested to perform a transesophageal echocardiogram on 09/03/2020 for abnormal echo.  After careful review of history and examination, the risks and benefits of transesophageal echocardiogram have been explained including risks of esophageal damage, perforation (1:10,000 risk), bleeding, pharyngeal hematoma as well as other potential complications associated with conscious sedation including aspiration, arrhythmia, respiratory failure and death. Alternatives to treatment were discussed, questions were answered. Patient is willing to proceed.   60 yo presented with stroke like symptom after his mother passed away. TTE showed small mobile lesion on the ventricular aspect of aortic valve vs artifact from adjacent calcium.   Azalee Course, PA-C 09/02/2020 4:04 PM

## 2020-09-03 ENCOUNTER — Encounter (HOSPITAL_COMMUNITY)
Admission: EM | Disposition: A | Discharge status: Home / Self Care | Payer: Self-pay | Source: Home / Self Care | Attending: Emergency Medicine

## 2020-09-03 ENCOUNTER — Observation Stay (HOSPITAL_COMMUNITY): Payer: Self-pay | Admitting: Anesthesiology

## 2020-09-03 ENCOUNTER — Encounter (HOSPITAL_COMMUNITY): Payer: Self-pay | Admitting: Internal Medicine

## 2020-09-03 ENCOUNTER — Other Ambulatory Visit (HOSPITAL_COMMUNITY): Payer: Self-pay

## 2020-09-03 ENCOUNTER — Observation Stay (HOSPITAL_BASED_OUTPATIENT_CLINIC_OR_DEPARTMENT_OTHER): Payer: Self-pay

## 2020-09-03 DIAGNOSIS — R9431 Abnormal electrocardiogram [ECG] [EKG]: Secondary | ICD-10-CM

## 2020-09-03 HISTORY — PX: BUBBLE STUDY: SHX6837

## 2020-09-03 HISTORY — PX: TEE WITHOUT CARDIOVERSION: SHX5443

## 2020-09-03 LAB — CBC
HCT: 51.3 % (ref 39.0–52.0)
Hemoglobin: 17 g/dL (ref 13.0–17.0)
MCH: 29.4 pg (ref 26.0–34.0)
MCHC: 33.1 g/dL (ref 30.0–36.0)
MCV: 88.8 fL (ref 80.0–100.0)
Platelets: 204 10*3/uL (ref 150–400)
RBC: 5.78 MIL/uL (ref 4.22–5.81)
RDW: 12.7 % (ref 11.5–15.5)
WBC: 9 10*3/uL (ref 4.0–10.5)
nRBC: 0 % (ref 0.0–0.2)

## 2020-09-03 LAB — BASIC METABOLIC PANEL
Anion gap: 7 (ref 5–15)
BUN: 15 mg/dL (ref 6–20)
CO2: 28 mmol/L (ref 22–32)
Calcium: 9 mg/dL (ref 8.9–10.3)
Chloride: 98 mmol/L (ref 98–111)
Creatinine, Ser: 0.9 mg/dL (ref 0.61–1.24)
GFR, Estimated: >60 mL/min (ref >60)
Glucose, Bld: 107 mg/dL — ABNORMAL HIGH (ref 70–99)
Potassium: 3.8 mmol/L (ref 3.5–5.1)
Sodium: 133 mmol/L — ABNORMAL LOW (ref 135–145)

## 2020-09-03 LAB — MAGNESIUM: Magnesium: 2.1 mg/dL (ref 1.7–2.4)

## 2020-09-03 SURGERY — ECHOCARDIOGRAM, TRANSESOPHAGEAL
Anesthesia: Monitor Anesthesia Care

## 2020-09-03 MED ORDER — ATORVASTATIN CALCIUM 80 MG PO TABS
80.0000 mg | ORAL_TABLET | Freq: Every day | ORAL | 0 refills | Status: DC
  Filled 2020-09-03: qty 30, 30d supply, fill #0

## 2020-09-03 MED ORDER — PHENYLEPHRINE 40 MCG/ML (10ML) SYRINGE FOR IV PUSH (FOR BLOOD PRESSURE SUPPORT)
PREFILLED_SYRINGE | INTRAVENOUS | Status: DC | PRN
  Administered 2020-09-03: 120 ug via INTRAVENOUS
  Administered 2020-09-03 (×2): 80 ug via INTRAVENOUS

## 2020-09-03 MED ORDER — BUTAMBEN-TETRACAINE-BENZOCAINE 2-2-14 % EX AERO
INHALATION_SPRAY | CUTANEOUS | Status: DC | PRN
  Administered 2020-09-03: 2 via TOPICAL

## 2020-09-03 MED ORDER — CLOPIDOGREL BISULFATE 75 MG PO TABS
75.0000 mg | ORAL_TABLET | Freq: Every day | ORAL | 0 refills | Status: AC
  Filled 2020-09-03: qty 21, 21d supply, fill #0

## 2020-09-03 MED ORDER — ASPIRIN 81 MG PO TBEC
81.0000 mg | DELAYED_RELEASE_TABLET | Freq: Every day | ORAL | 0 refills | Status: AC
  Filled 2020-09-03: qty 90, 90d supply, fill #0

## 2020-09-03 MED ORDER — LIDOCAINE 2% (20 MG/ML) 5 ML SYRINGE
INTRAMUSCULAR | Status: DC | PRN
  Administered 2020-09-03: 60 mg via INTRAVENOUS

## 2020-09-03 MED ORDER — PROPOFOL 500 MG/50ML IV EMUL
INTRAVENOUS | Status: DC | PRN
  Administered 2020-09-03: 100 ug/kg/min via INTRAVENOUS

## 2020-09-03 MED ORDER — PROPOFOL 10 MG/ML IV BOLUS
INTRAVENOUS | Status: DC | PRN
  Administered 2020-09-03: 25 mg via INTRAVENOUS
  Administered 2020-09-03 (×2): 50 mg via INTRAVENOUS

## 2020-09-03 NOTE — Anesthesia Postprocedure Evaluation (Signed)
Anesthesia Post Note  Patient: Nathaniel Hernandez  Procedure(s) Performed: TRANSESOPHAGEAL ECHOCARDIOGRAM (TEE) BUBBLE STUDY     Patient location during evaluation: Endoscopy Anesthesia Type: MAC Level of consciousness: awake and alert Pain management: pain level controlled Vital Signs Assessment: post-procedure vital signs reviewed and stable Respiratory status: spontaneous breathing, nonlabored ventilation, respiratory function stable and patient connected to nasal cannula oxygen Cardiovascular status: blood pressure returned to baseline and stable Postop Assessment: no apparent nausea or vomiting Anesthetic complications: no   No notable events documented.  Last Vitals:  Vitals:   09/03/20 0845 09/03/20 1201  BP: (!) 155/89 (!) 143/95  Pulse: 75 77  Resp: 16 16  Temp: (!) 36.3 C 37 C  SpO2: 96% 97%    Last Pain:  Vitals:   09/03/20 1201  TempSrc: Oral  PainSc:                  Katie Moch L Riyah Bardon

## 2020-09-03 NOTE — Anesthesia Preprocedure Evaluation (Addendum)
Anesthesia Evaluation  Patient identified by MRN, date of birth, ID band Patient awake    Reviewed: Allergy & Precautions, NPO status , Patient's Chart, lab work & pertinent test results  Airway Mallampati: II  TM Distance: >3 FB Neck ROM: Full    Dental  (+) Chipped, Missing, Loose,    Pulmonary neg pulmonary ROS,    Pulmonary exam normal breath sounds clear to auscultation       Cardiovascular hypertension, Normal cardiovascular exam Rhythm:Regular Rate:Normal  TTE 2022 1. Cannot exclude small mobile lesion on ventricular aspect of aortic  valve vs. artifact from adjacent calcium. See recommendations. The aortic  valve is grossly normal. There is mild calcification of the aortic valve.  Aortic valve regurgitation is not  visualized. Mild aortic valve sclerosis is present, with no evidence of  aortic valve stenosis.  2. Left ventricular ejection fraction, by estimation, is 55 to 60%. The  left ventricle has normal function. The left ventricle has no regional  wall motion abnormalities. There is mild left ventricular hypertrophy.  Left ventricular diastolic parameters  were normal.  3. Right ventricular systolic function is normal. The right ventricular  size is normal. Tricuspid regurgitation signal is inadequate for assessing  PA pressure.  4. The mitral valve is normal in structure. No evidence of mitral valve  regurgitation. No evidence of mitral stenosis.  5. The inferior vena cava is normal in size with greater than 50%  respiratory variability, suggesting right atrial pressure of 3 mmHg.    Neuro/Psych TIAnegative psych ROS   GI/Hepatic negative GI ROS, Neg liver ROS,   Endo/Other  negative endocrine ROS  Renal/GU negative Renal ROS  negative genitourinary   Musculoskeletal negative musculoskeletal ROS (+)   Abdominal   Peds  Hematology negative hematology ROS (+)   Anesthesia Other Findings no  significant past medical history presenting to the ED on 09/01/20 as code stroke for acute onset right-sided weakness. Symptoms resolved by time of ED arrival.  Reproductive/Obstetrics                            Anesthesia Physical Anesthesia Plan  ASA: 2  Anesthesia Plan: MAC   Post-op Pain Management:    Induction: Intravenous  PONV Risk Score and Plan: Propofol infusion and Treatment may vary due to age or medical condition  Airway Management Planned: Natural Airway  Additional Equipment:   Intra-op Plan:   Post-operative Plan:   Informed Consent: I have reviewed the patients History and Physical, chart, labs and discussed the procedure including the risks, benefits and alternatives for the proposed anesthesia with the patient or authorized representative who has indicated his/her understanding and acceptance.     Dental advisory given  Plan Discussed with: CRNA  Anesthesia Plan Comments:         Anesthesia Quick Evaluation

## 2020-09-03 NOTE — Progress Notes (Signed)
  Echocardiogram Echocardiogram Transesophageal has been performed.  [name] 09/03/2020, 8:30 AM

## 2020-09-03 NOTE — Progress Notes (Signed)
    Transesophageal Echocardiogram Note  Muhammad Vacca [id number] 1960/06/01  Procedure: Transesophageal Echocardiogram Indications: TIA  Procedure Details Consent: Obtained Time Out: Verified patient identification, verified procedure, site/side was marked, verified correct patient position, special equipment/implants available, Radiology Safety Procedures followed,  medications/allergies/relevent history reviewed, required imaging and test results available.  Performed  Medications:  Pt sedated by anesthesia with lidocaine 60 mg and diprovan 235 mg IV.  Normal LV function;  atrial septal aneurysm; mildly sclerotic aortic valve; no vegetations. Negative saline microcavitation study.   Complications: No apparent complications Patient did tolerate procedure well.  Olga Millers, MD

## 2020-09-03 NOTE — Progress Notes (Signed)
Patient has ordered for discharge. Given discharge instructions with paper to the patient and family. Iv removed. Given all belongings to the patient. 

## 2020-09-03 NOTE — Discharge Summary (Signed)
Physician Discharge Summary  Lynnwood Beckford WUJ:[id number] DOB: Jun 09, 1960 DOA: 09/01/2020  PCP: Default, Provider, MD  Admit date: 09/01/2020 Discharge date: 09/03/2020  Admitted From: Home Disposition: Home  Recommendations for Outpatient Follow-up:  Follow up with PCP in 1-2 weeks Please obtain BMP/CBC in one week your next doctors visit.  And Plavix for 21 days followed by aspirin alone Outpatient follow-up with neurology in 3-4 weeks Would benefit from outpatient 30-day ambulatory monitoring   Discharge Condition: Stable CODE STATUS: Full code Diet recommendation: Heart healthy  Brief/Interim Summary: 60 year old without significant past medical history admitted for acute right-sided weakness started at 2 AM on the morning of admission.  CT head, CTA head and neck is negative.  Neurology consulted.  Symptoms resolved by the time he arrived to the ED.  Patient's son stated that patient's mother had passed away few hours prior to admission.  A1c was 5.4, LDL 149, UDS was negative.  Echo showed EF 55 to 60% with small mobile density on the ventricular aspect of aortic valve.  Cardiology team notified to help obtain TEE.  TEE was negative for any vegetation or thrombus.  Patient is medically stable for discharge with recommendations as stated above.     Assessment & Plan:   Principal Problem:   TIA (transient ischemic attack) Active Problems:   Metabolic acidosis   Right-sided weakness, resolved Transient ischemic attack - CT head, CTA head and neck and MRI brain negative.  A1c 5.4.  LDL 149 - Aspirin Plavix for 3 weeks followed by aspirin.  Lipitor 80 mg - Echo showed EF 55 to 60% with mobile density on the aortic valve - TEE negative for any vegetation or thrombus - Seen by neurology team, outpatient follow-up in 3-4 weeks.  Will need 30-day ambulatory monitor which can be arranged by PCP. -UDS-negative   Elevated hemoglobin - Concern for polycythemia?.  Follow-up outpatient  PCP, does not have one therefore case manager assisted at the time of discharge      Body mass index is 28.13 kg/m.         Discharge Diagnoses:  Principal Problem:   TIA (transient ischemic attack) Active Problems:   Metabolic acidosis      Consultations: Neurology  Subjective: Feels great back to his baseline.  Both of his sons are present at bedside during my visit, extensive instructions given regarding his follow-up.  Discharge Exam: Vitals:   09/03/20 0845 09/03/20 1201  BP: (!) 155/89 (!) 143/95  Pulse: 75 77  Resp: 16 16  Temp: (!) 97.4 F (36.3 C) 98.6 F (37 C)  SpO2: 96% 97%   Vitals:   09/03/20 0822 09/03/20 0832 09/03/20 0845 09/03/20 1201  BP: (!) 153/89 (!) 136/95 (!) 155/89 (!) 143/95  Pulse: 81 79 75 77  Resp: Temp: 98.1 F (36.7 C)  (!) 97.4 F (36.3 C) 98.6 F (37 C)  TempSrc: Axillary  Oral Oral  SpO2: 99% 98% 96% 97%  Weight:      Height:        General: Pt is alert, awake, not in acute distress Cardiovascular: RRR, S1/S2 +, no rubs, no gallops Respiratory: CTA bilaterally, no wheezing, no rhonchi Abdominal: Soft, NT, ND, bowel sounds + Extremities: no edema, no cyanosis  Discharge Instructions  Discharge Instructions     Ambulatory referral to Neurology   Complete by: As directed    Follow up with stroke clinic NP (Jessica Vanschaick or Darrol Angel, if both not available, consider Pearlean Brownie,  Penumali, or Ahern) [facility] weeks. Thanks.      Allergies as of 09/03/2020   No Known Allergies      Medication List     STOP taking these medications    ibuprofen 200 MG tablet Commonly known as: ADVIL       TAKE these medications    Aspirin Low Dose 81 MG EC tablet Generic drug: aspirin Take 1 tablet (81 mg total) by mouth daily. Swallow whole.   atorvastatin 80 MG tablet Commonly known as: LIPITOR Tome 1 tableta (80 mg en total) por va oral diariamente. (Take 1 tablet (80 mg total) by  mouth daily.)   clopidogrel 75 MG tablet Commonly known as: PLAVIX Take 1 tablet (75 mg total) by mouth daily for 21 days.        Follow-up Information     Micki RileySethi, Pramod S, MD Follow up in 4 week(s).   Specialties: Neurology, Radiology Contact information: [address] [place] [address] [phone]         [facility] Follow up.   Specialty: Family Medicine Why: October 10, 2020 at 1:30 pm Contact information: [place] [place] [phone]               No Known Allergies  You were cared for by a hospitalist during your hospital stay. If you have any questions about your discharge medications or the care you received while you were in the hospital after you are discharged, you can call the unit and asked to speak with the hospitalist on call if the hospitalist that took care of you is not available. Once you are discharged, your primary care physician will handle any further medical issues. Please note that no refills for any discharge medications will be authorized once you are discharged, as it is imperative that you return to your primary care physician (or establish a relationship with a primary care physician if you do not have one) for your aftercare needs so that they can reassess your need for medications and monitor your lab values.   Procedures/Studies: MR BRAIN WO CONTRAST  Result Date: 09/01/2020 CLINICAL DATA:  TIA EXAM: MRI HEAD WITHOUT CONTRAST TECHNIQUE: Multiplanar, multiecho pulse sequences of the brain and surrounding structures were obtained without intravenous contrast. COMPARISON:  Head CT and CTA from yesterday FINDINGS: Brain: No acute infarction, hemorrhage, hydrocephalus, extra-axial collection or mass lesion. Vascular: Normal flow voids. Skull and upper cervical spine: Normal marrow signal. Sinuses/Orbits: Negative. IMPRESSION: Normal brain MRI Electronically  Signed   By: Marnee SpringJonathon  Watts M.D.   On: 09/01/2020 09:38   ECHOCARDIOGRAM COMPLETE  Result Date: 09/01/2020    ECHOCARDIOGRAM REPORT   Patient Name:   Tobe SosLBERTO Spake Date of Exam: 09/01/2020 Medical Rec #:  [id number]       Height:       68.0 in Accession #:    [id number]      Weight:       185.0 lb Date of Birth:  [birth year]       BSA:          1.977 m Patient Age:    60 years        BP:           1524/102 mmHg Patient Gender: M               HR:           98 bpm. Exam  Location:  Inpatient Procedure: 2D Echo, Cardiac Doppler and Color Doppler Indications:    TIA  History:        Patient has no prior history of Echocardiogram examinations.  Sonographer:    Neomia Dear RDCS Referring Phys: [id number] VASUNDHRA RATHORE IMPRESSIONS  1. Cannot exclude small mobile lesion on ventricular aspect of aortic valve vs. artifact from adjacent calcium. See recommendations. The aortic valve is grossly normal. There is mild calcification of the aortic valve. Aortic valve regurgitation is not visualized. Mild aortic valve sclerosis is present, with no evidence of aortic valve stenosis.  2. Left ventricular ejection fraction, by estimation, is 55 to 60%. The left ventricle has normal function. The left ventricle has no regional wall motion abnormalities. There is mild left ventricular hypertrophy. Left ventricular diastolic parameters were normal.  3. Right ventricular systolic function is normal. The right ventricular size is normal. Tricuspid regurgitation signal is inadequate for assessing PA pressure.  4. The mitral valve is normal in structure. No evidence of mitral valve regurgitation. No evidence of mitral stenosis.  5. The inferior vena cava is normal in size with greater than 50% respiratory variability, suggesting right atrial pressure of 3 mmHg. Conclusion(s)/Recommendation(s): Indeterminate small mobile echodensity on the ventricular aspect of aortic valve. A transesophageal echocardiogram can be considered to exclude  cardiac source of embolism if clinically indicated. FINDINGS  Left Ventricle: Left ventricular ejection fraction, by estimation, is 55 to 60%. The left ventricle has normal function. The left ventricle has no regional wall motion abnormalities. The left ventricular internal cavity size was normal in size. There is  mild left ventricular hypertrophy. Left ventricular diastolic parameters were normal. Right Ventricle: The right ventricular size is normal. No increase in right ventricular wall thickness. Right ventricular systolic function is normal. Tricuspid regurgitation signal is inadequate for assessing PA pressure. Left Atrium: Left atrial size was normal in size. Right Atrium: Right atrial size was normal in size. Pericardium: There is no evidence of pericardial effusion. Presence of pericardial fat pad. Mitral Valve: The mitral valve is normal in structure. No evidence of mitral valve regurgitation. No evidence of mitral valve stenosis. Tricuspid Valve: The tricuspid valve is normal in structure. Tricuspid valve regurgitation is not demonstrated. No evidence of tricuspid stenosis. Aortic Valve: Cannot exclude small mobile lesion on ventricular aspect of aortic valve vs. artifact from adjacent calcium. See recommendations. The aortic valve is grossly normal. There is mild calcification of the aortic valve. Aortic valve regurgitation is not visualized. Mild aortic valve sclerosis is present, with no evidence of aortic valve stenosis. Aortic valve mean gradient measures 5.0 mmHg. Aortic valve peak gradient measures 8.8 mmHg. Aortic valve area, by VTI measures 2.98 cm. Pulmonic Valve: The pulmonic valve was normal in structure. Pulmonic valve regurgitation is not visualized. No evidence of pulmonic stenosis. Aorta: The aortic root is normal in size and structure. Venous: The inferior vena cava is normal in size with greater than 50% respiratory variability, suggesting right atrial pressure of 3 mmHg. IAS/Shunts:  No atrial level shunt detected by color flow Doppler.  LEFT VENTRICLE PLAX 2D LVIDd:         3.80 cm     Diastology LV PW:         1.10 cm     LV e' medial:    8.05 cm/s LV IVS:        1.40 cm     LV E/e' medial:  9.9 LV SV:  69          LV e' lateral:   13.80 cm/s LV SV Index:   35          LV E/e' lateral: 5.8 LVOT Area:     3.80 cm  LV Volumes (MOD) LV vol d, MOD A2C: 58.7 ml LV vol d, MOD A4C: 87.2 ml LV vol s, MOD A2C: 18.7 ml LV vol s, MOD A4C: 36.3 ml LV SV MOD A2C:     40.0 ml LV SV MOD A4C:     87.2 ml LV SV MOD BP:      45.0 ml RIGHT VENTRICLE RV Basal diam:  3.70 cm RV Mid diam:    2.90 cm RV S prime:     21.30 cm/s TAPSE (M-mode): 2.8 cm LEFT ATRIUM             Index       RIGHT ATRIUM           Index LA Vol (A2C):   52.8 ml 26.71 ml/m RA Area:     10.80 cm LA Vol (A4C):   28.6 ml 14.47 ml/m RA Volume:   21.20 ml  10.72 ml/m LA Biplane Vol: 40.0 ml 20.23 ml/m  AORTIC VALVE                   PULMONIC VALVE AV Area (Vmax):    2.88 cm    PV Vmax:       0.82 m/s AV Area (Vmean):   2.89 cm    PV Vmean:      82.000 cm/s AV Area (VTI):     2.98 cm    PV VTI:        0.203 m AV Vmax:           148.00 cm/s PV Peak grad:  2.7 mmHg AV Vmean:          97.700 cm/s PV Mean grad:  3.0 mmHg AV VTI:            0.232 m AV Peak Grad:      8.8 mmHg AV Mean Grad:      5.0 mmHg LVOT Vmax:         112.00 cm/s LVOT Vmean:        74.400 cm/s LVOT VTI:          0.182 m LVOT/AV VTI ratio: 0.78 MITRAL VALVE MV Area (PHT): 4.57 cm     SHUNTS MV Decel Time: 166 msec     Systemic VTI: 0.18 m MV E velocity: 79.60 cm/s MV A velocity: 100.00 cm/s MV E/A ratio:  0.80 Weston Brass MD Electronically signed by Weston Brass MD Signature Date/Time: 09/01/2020/4:54:30 PM    Final    CT HEAD CODE STROKE WO CONTRAST  Result Date: 09/01/2020 CLINICAL DATA:  Code stroke.  Right-sided deficits EXAM: CT HEAD WITHOUT CONTRAST CT ANGIOGRAPHY OF THE HEAD AND NECK TECHNIQUE: Contiguous axial images were obtained from the base of  the skull through the vertex without intravenous contrast. Multidetector CT imaging of the head and neck was performed using the standard protocol during bolus administration of intravenous contrast. Multiplanar CT image reconstructions and MIPs were obtained to evaluate the vascular anatomy. Carotid stenosis measurements (when applicable) are obtained utilizing NASCET criteria, using the distal internal carotid diameter as the denominator. COMPARISON:  None. FINDINGS: CT HEAD FINDINGS Brain: There is no mass, hemorrhage or extra-axial collection. The size and configuration of the ventricles and extra-axial CSF spaces  are normal. The brain parenchyma is normal, without evidence of acute or chronic infarction. Vascular: No abnormal hyperdensity of the major intracranial arteries or dural venous sinuses. No intracranial atherosclerosis. Skull: The visualized skull base, calvarium and extracranial soft tissues are normal. Sinuses/Orbits: No fluid levels or advanced mucosal thickening of the visualized paranasal sinuses. No mastoid or middle ear effusion. The orbits are normal. ASPECTS ([place] Stroke Program Early CT Score) - Ganglionic level infarction (caudate, lentiform nuclei, internal capsule, insula, M1-M3 cortex): 7 - Supraganglionic infarction (M4-M6 cortex): 3 Total score (0-10 with 10 being normal): 10 CTA NECK FINDINGS SKELETON: There is no bony spinal canal stenosis. No lytic or blastic lesion. OTHER NECK: Normal pharynx, larynx and major salivary glands. No cervical lymphadenopathy. Unremarkable thyroid gland. UPPER CHEST: No pneumothorax or pleural effusion. No nodules or masses. AORTIC ARCH: There is no calcific atherosclerosis of the aortic arch. There is no aneurysm, dissection or hemodynamically significant stenosis of the visualized portion of the aorta. Normal variant aortic arch branching pattern with the left vertebral artery arising independently from the aortic arch. The visualized proximal  subclavian arteries are widely patent. RIGHT CAROTID SYSTEM: Normal without aneurysm, dissection or stenosis. LEFT CAROTID SYSTEM: Normal without aneurysm, dissection or stenosis. VERTEBRAL ARTERIES: Left dominant configuration. Both origins are clearly patent. There is no dissection, occlusion or flow-limiting stenosis to the skull base (V1-V3 segments). CTA HEAD FINDINGS POSTERIOR CIRCULATION: --Vertebral arteries: Normal V4 segments. --Inferior cerebellar arteries: Normal. --Basilar artery: Normal. --Superior cerebellar arteries: Normal. --Posterior cerebral arteries (PCA): Normal. ANTERIOR CIRCULATION: --Intracranial internal carotid arteries: Normal. --Anterior cerebral arteries (ACA): Normal. Both A1 segments are present. Patent anterior communicating artery (a-comm). --Middle cerebral arteries (MCA): Normal. VENOUS SINUSES: As permitted by contrast timing, patent. ANATOMIC VARIANTS: Fetal origin of the left posterior cerebral artery. Review of the MIP images confirms the above findings. IMPRESSION: 1. Normal head CT. 2. ASPECTS is 10. 3. No emergent large vessel occlusion. Unremarkable CTA of the head and neck. These results were called by telephone at the time of interpretation on 09/01/2020 at 3:29 am to Dr. Judd Lien, who verbally acknowledged these results. Electronically Signed   By: Deatra Robinson M.D.   On: 09/01/2020 03:43   CT ANGIO HEAD NECK W WO CM (CODE STROKE)  Result Date: 09/01/2020 CLINICAL DATA:  Code stroke.  Right-sided deficits EXAM: CT HEAD WITHOUT CONTRAST CT ANGIOGRAPHY OF THE HEAD AND NECK TECHNIQUE: Contiguous axial images were obtained from the base of the skull through the vertex without intravenous contrast. Multidetector CT imaging of the head and neck was performed using the standard protocol during bolus administration of intravenous contrast. Multiplanar CT image reconstructions and MIPs were obtained to evaluate the vascular anatomy. Carotid stenosis measurements (when applicable)  are obtained utilizing NASCET criteria, using the distal internal carotid diameter as the denominator. COMPARISON:  None. FINDINGS: CT HEAD FINDINGS Brain: There is no mass, hemorrhage or extra-axial collection. The size and configuration of the ventricles and extra-axial CSF spaces are normal. The brain parenchyma is normal, without evidence of acute or chronic infarction. Vascular: No abnormal hyperdensity of the major intracranial arteries or dural venous sinuses. No intracranial atherosclerosis. Skull: The visualized skull base, calvarium and extracranial soft tissues are normal. Sinuses/Orbits: No fluid levels or advanced mucosal thickening of the visualized paranasal sinuses. No mastoid or middle ear effusion. The orbits are normal. ASPECTS [facility] Stroke Program Early CT Score) - Ganglionic level infarction (caudate, lentiform nuclei, internal capsule, insula, M1-M3 cortex): 7 - Supraganglionic infarction (M4-M6 cortex): 3  Total score (0-10 with 10 being normal): 10 CTA NECK FINDINGS SKELETON: There is no bony spinal canal stenosis. No lytic or blastic lesion. OTHER NECK: Normal pharynx, larynx and major salivary glands. No cervical lymphadenopathy. Unremarkable thyroid gland. UPPER CHEST: No pneumothorax or pleural effusion. No nodules or masses. AORTIC ARCH: There is no calcific atherosclerosis of the aortic arch. There is no aneurysm, dissection or hemodynamically significant stenosis of the visualized portion of the aorta. Normal variant aortic arch branching pattern with the left vertebral artery arising independently from the aortic arch. The visualized proximal subclavian arteries are widely patent. RIGHT CAROTID SYSTEM: Normal without aneurysm, dissection or stenosis. LEFT CAROTID SYSTEM: Normal without aneurysm, dissection or stenosis. VERTEBRAL ARTERIES: Left dominant configuration. Both origins are clearly patent. There is no dissection, occlusion or flow-limiting stenosis to the skull base (V1-V3  segments). CTA HEAD FINDINGS POSTERIOR CIRCULATION: --Vertebral arteries: Normal V4 segments. --Inferior cerebellar arteries: Normal. --Basilar artery: Normal. --Superior cerebellar arteries: Normal. --Posterior cerebral arteries (PCA): Normal. ANTERIOR CIRCULATION: --Intracranial internal carotid arteries: Normal. --Anterior cerebral arteries (ACA): Normal. Both A1 segments are present. Patent anterior communicating artery (a-comm). --Middle cerebral arteries (MCA): Normal. VENOUS SINUSES: As permitted by contrast timing, patent. ANATOMIC VARIANTS: Fetal origin of the left posterior cerebral artery. Review of the MIP images confirms the above findings. IMPRESSION: 1. Normal head CT. 2. ASPECTS is 10. 3. No emergent large vessel occlusion. Unremarkable CTA of the head and neck. These results were called by telephone at the time of interpretation on 09/01/2020 at 3:29 am to Dr. Judd Lien, who verbally acknowledged these results. Electronically Signed   By: Deatra Robinson M.D.   On: 09/01/2020 03:43     The results of significant diagnostics from this hospitalization (including imaging, microbiology, ancillary and laboratory) are listed below for reference.     Microbiology: Recent Results (from the past 240 hour(s))  SARS CORONAVIRUS 2 (TAT 6-24 HRS) Nasopharyngeal Nasopharyngeal Swab     Status: None   Collection Time: 09/01/20  4:06 AM   Specimen: Nasopharyngeal Swab  Result Value Ref Range Status   SARS Coronavirus 2 NEGATIVE NEGATIVE Final    Comment: (NOTE) SARS-CoV-2 target nucleic acids are NOT DETECTED.  The SARS-CoV-2 RNA is generally detectable in upper and lower respiratory specimens during the acute phase of infection. Negative results do not preclude SARS-CoV-2 infection, do not rule out co-infections with other pathogens, and should not be used as the sole basis for treatment or other patient management decisions. Negative results must be combined with clinical observations, patient  history, and epidemiological information. The expected result is Negative.  Fact Sheet for Patients: HairSlick.no  Fact Sheet for Healthcare Providers: quierodirigir.com  This test is not yet approved or cleared by the [place] FDA and  has been authorized for detection and/or diagnosis of SARS-CoV-2 by FDA under an Emergency Use Authorization (EUA). This EUA will remain  in effect (meaning this test can be used) for the duration of the COVID-19 declaration under Se ction 564(b)(1) of the Act, 21 U.S.C. section 360bbb-3(b)(1), unless the authorization is terminated or revoked sooner.  Performed at [facility], [address], [place], [place] [address]      Labs: BNP (last 3 results) No results for input(s): BNP in the last 8760 hours. Basic Metabolic Panel: Recent Labs  Lab 09/01/20 0307 09/01/20 0311 09/02/20 0351 09/03/20 0231  NA 136 139 136 133*  K 3.7 3.6 4.0 3.8  CL 105 105 103 98  CO2 19*  --  25 28  GLUCOSE 120* 124* 105* 107*  BUN 14 14 14 15   CREATININE 0.79 1.00 0.83 0.90  CALCIUM 8.7*  --  9.0 9.0  MG  --   --  2.1 2.1   Liver Function Tests: Recent Labs  Lab 09/01/20 0307  AST 31  ALT 35  ALKPHOS 88  BILITOT 0.6  PROT 6.9  ALBUMIN 3.8   No results for input(s): LIPASE, AMYLASE in the last 168 hours. No results for input(s): AMMONIA in the last 168 hours. CBC: Recent Labs  Lab 09/01/20 0307 09/01/20 0311 09/02/20 0351 09/03/20 0231  WBC 8.6  --  8.9 9.0  NEUTROABS 5.2  --   --   --   HGB 17.2* 17.3* 17.0 17.0  HCT 50.9 51.0 50.2 51.3  MCV 88.8  --  87.9 88.8  PLT 197  --  203 204   Cardiac Enzymes: No results for input(s): CKTOTAL, CKMB, CKMBINDEX, TROPONINI in the last 168 hours. BNP: Invalid input(s): POCBNP CBG: Recent Labs  Lab 09/01/20 0304  GLUCAP 131*   D-Dimer No results for input(s): DDIMER in the last 72 hours. Hgb A1c Recent Labs    09/01/20 0307   HGBA1C 5.4   Lipid Profile Recent Labs    09/01/20 0303  CHOL 332*  HDL 41  LDLCALC UNABLE TO CALCULATE IF TRIGLYCERIDE OVER 400 mg/dL  TRIG 11/01/20*  CHOLHDL 8.1  LDLDIRECT 149.9*   Thyroid function studies No results for input(s): TSH, T4TOTAL, T3FREE, THYROIDAB in the last 72 hours.  Invalid input(s): FREET3 Anemia work up No results for input(s): VITAMINB12, FOLATE, FERRITIN, TIBC, IRON, RETICCTPCT in the last 72 hours. Urinalysis No results found for: COLORURINE, APPEARANCEUR, LABSPEC, PHURINE, GLUCOSEU, HGBUR, BILIRUBINUR, KETONESUR, PROTEINUR, UROBILINOGEN, NITRITE, LEUKOCYTESUR Sepsis Labs Invalid input(s): PROCALCITONIN,  WBC,  LACTICIDVEN Microbiology Recent Results (from the past 240 hour(s))  SARS CORONAVIRUS 2 (TAT 6-24 HRS) Nasopharyngeal Nasopharyngeal Swab     Status: None   Collection Time: 09/01/20  4:06 AM   Specimen: Nasopharyngeal Swab  Result Value Ref Range Status   SARS Coronavirus 2 NEGATIVE NEGATIVE Final    Comment: (NOTE) SARS-CoV-2 target nucleic acids are NOT DETECTED.  The SARS-CoV-2 RNA is generally detectable in upper and lower respiratory specimens during the acute phase of infection. Negative results do not preclude SARS-CoV-2 infection, do not rule out co-infections with other pathogens, and should not be used as the sole basis for treatment or other patient management decisions. Negative results must be combined with clinical observations, patient history, and epidemiological information. The expected result is Negative.  Fact Sheet for Patients: 11/01/20  Fact Sheet for Healthcare Providers: HairSlick.no  This test is not yet approved or cleared by the [place] FDA and  has been authorized for detection and/or diagnosis of SARS-CoV-2 by FDA under an Emergency Use Authorization (EUA). This EUA will remain  in effect (meaning this test can be used) for the duration  of the COVID-19 declaration under Se ction 564(b)(1) of the Act, 21 U.S.C. section 360bbb-3(b)(1), unless the authorization is terminated or revoked sooner.  Performed at [facility], [address], [place], [place]      Time coordinating discharge:  I have spent 35 minutes face to face with the patient and on the ward discussing the patients care, assessment, plan and disposition with other care givers. >50% of the time was devoted counseling the patient about the risks and benefits of treatment/Discharge disposition and coordinating care.   SIGNED:  Morgan Keinath Joline Maxcy, MD  [facility] 09/03/2020, 1:31 PM   If 7PM-7AM, please contact night-coverage

## 2020-09-03 NOTE — Progress Notes (Addendum)
STROKE TEAM PROGRESS NOTE   INTERVAL HISTORY Two sons are at the bedside. Pt sitting in chair, waiting to be discharged. His TEE was unremarkable, no vegetation or thrombus, no PFO. Pt no complains and asymptomatic now.   Vitals:   09/03/20 0822 09/03/20 0832 09/03/20 0845 09/03/20 1201  BP: (!) 153/89 (!) 136/95 (!) 155/89 (!) 143/95  Pulse: 81 79 75 77  Resp: 10 10 16 16   Temp: 98.1 F (36.7 C)  (!) 97.4 F (36.3 C) 98.6 F (37 C)  TempSrc: Axillary  Oral Oral  SpO2: 99% 98% 96% 97%  Weight:      Height:       CBC:  Recent Labs  Lab 09/01/20 0307 09/01/20 0311 09/02/20 0351 09/03/20 0231  WBC 8.6  --  8.9 9.0  NEUTROABS 5.2  --   --   --   HGB 17.2*   < > 17.0 17.0  HCT 50.9   < > 50.2 51.3  MCV 88.8  --  87.9 88.8  PLT 197  --  203 204   < > = values in this interval not displayed.   Basic Metabolic Panel:  Recent Labs  Lab 09/02/20 0351 09/03/20 0231  NA 136 133*  K 4.0 3.8  CL 103 98  CO2 25 28  GLUCOSE 105* 107*  BUN 14 15  CREATININE 0.83 0.90  CALCIUM 9.0 9.0  MG 2.1 2.1   Lipid Panel:  Recent Labs  Lab 09/01/20 0303  CHOL 332*  TRIG 790*  HDL 41  CHOLHDL 8.1  VLDL UNABLE TO CALCULATE IF TRIGLYCERIDE OVER 400 mg/dL  LDLCALC UNABLE TO CALCULATE IF TRIGLYCERIDE OVER 400 mg/dL   11/01/20:  Recent Labs  Lab 09/01/20 0307  HGBA1C 5.4   Urine Drug Screen:  Recent Labs  Lab 09/01/20 0940  LABOPIA NONE DETECTED  COCAINSCRNUR NONE DETECTED  LABBENZ NONE DETECTED  AMPHETMU NONE DETECTED  THCU NONE DETECTED  LABBARB NONE DETECTED    Alcohol Level No results for input(s): ETH in the last 168 hours.  IMAGING past 24 hours No results found.  PHYSICAL EXAM Constitutional: Appears well-developed and well-nourished. Psych: Affect appropriate to situation Eyes: No scleral injection HENT: No OP obstruction Cardiovascular: Normal rate and regular rhythm. Respiratory: Effort normal, non-labored breathing Skin: WDI   Neuro: Mental  Status: Patient is alert, oriented x4 Patient is able to give a clear and coherent history. Speech is clear, fluent and appropriate in context per interpretor.  Cranial Nerves: II: Visual Fields are full. Pupils are equal, round, and reactive to light.   III,IV, VI: EOMI without ptosis or diploplia. V: Facial sensation is symmetric to temperature VII: Facial movement is symmetric. VIII: hearing is intact to voice X: Uvula elevates symmetrically XI: Shoulder shrug is symmetric. XII: tongue is midline without atrophy or fasciculations. Motor: Tone is normal. Bulk is normal. 5/5 strength was present in all four extremities. Sensory: Sensation is symmetric to light touch and temperature in the arms and legs. Cerebellar: FNF and HKS are intact bilaterally  ASSESSMENT/PLAN Nathaniel Hernandez is a 60 y.o. male with no significant past medical history who presents with right-sided weakness and difficulty speaking that was abrupt in onset and lasted for approximately 10 minutes.  He was up with his family around 2 AM when he had abrupt onset right arm and leg weakness with speech difficulty.  EMS was called, and on arrival they found him to be completely flaccid on the right however he began improving in transit  and by the time of arrival to the emergency department he was back to normal.  They had activated a code stroke prior to him returning to normal and therefore he was evaluated as a code stroke on arrival.  TIA - likely left brain cortical TIA, etiology unclear Code Stroke CT head No acute abnormality. ASPECTS 10.    CTA head & neck No LVO or other acute  MRI  Brain Normal  2D Echo EF 55-60%. Cannot exclude small mobile lesion on ventricular aspect of aortic valve vs. artifact from adjacent calcium. TEE no thrombus or vegetation, no PFO No insurance, will recommend 30 day cardiac event monitoring to rule out afib as outpt LDL direct 149.9,Triglycerides 790 HgbA1c 5.4 VTE prophylaxis -  SCDs Recommend DAPT x 3 weeks then ASA 81mg  alone  Therapy recommendations:  Cleared without needs  Disposition:  Home   HTN Home meds:  None, no hx of diagnosed HTN BP elevated on presentation Long-term BP goal normotensive Close PCP follow up  Hyperlipidemia Home meds:  None, new diagnosis  LDL direct 149, goal < 70 High intensity statin: Atoravstatin 80mg  initiated  Continue statin at discharge Close PCP Follow up        Elevated RBCs       ?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 Please call with questions. Pt will follow up with stroke clinic NP at [facility] in about 4 weeks. Thanks for the consult.  , MD PhD Stroke Neurology 09/03/2020 12:41 PM    To contact Stroke Continuity provider, please refer to Marvel Plan. After hours, contact General Neurology

## 2020-09-03 NOTE — Interval H&P Note (Signed)
History and Physical Interval Note:  09/03/2020 7:36 AM  Nathaniel Hernandez  has presented today for surgery, with the diagnosis of stroke.  The various methods of treatment have been discussed with the patient and family. After consideration of risks, benefits and other options for treatment, the patient has consented to  Procedure(s): TRANSESOPHAGEAL ECHOCARDIOGRAM (TEE) (N/A) as a surgical intervention.  The patient's history has been reviewed, patient examined, no change in status, stable for surgery.  I have reviewed the patient's chart and labs.  Questions were answered to the patient's satisfaction.     Olga Millers

## 2020-09-03 NOTE — TOC Initial Note (Signed)
Transition of Care [facility]) - Initial/Assessment Note    Patient Details  Name: Nathaniel Hernandez MRN: [id number] Date of Birth: 1960-10-17  [facility]) CM/SW Contact:    Kingsley Plan, RN Phone Number: 09/03/2020, 11:20 AM  Clinical Narrative:                  Spoke to patient and son Nathaniel Hernandez at bedside .   They have questions regarding hospital bill. Patent examiner, also explained when they get patient's bill there will be a number on there to call.   Explained MATCH program. [facility] will fill prescription medications free of charge, however they will need to pay for any over the counter medications. MATCH is only available one time per year.   Patient needs a PCP. NCM called [facility], first available is [facility] October 10, 2020, information placed on AVS.   Both voiced understanding. Expected Discharge Plan: Home/Self Care Barriers to Discharge: No Barriers Identified   Patient Goals and CMS Choice Patient states their goals for this hospitalization and ongoing recovery are:: to go home CMS Medicare.gov Compare Post Acute Care list provided to:: Patient    Expected Discharge Plan and Services Expected Discharge Plan: Home/Self Care In-house Referral: Financial Counselor Discharge Planning Services: CM Consult   Living arrangements for the past 2 months: Single Family Home Expected Discharge Date: 09/03/20               DME Arranged: N/A DME Agency: NA       HH Arranged: NA          Prior Living Arrangements/Services Living arrangements for the past 2 months: Single Family Home Lives with:: Relatives Patient language and need for interpreter reviewed:: Yes Do you feel safe going back to the place where you live?: Yes (LAnguage services)      Need for Family Participation in Patient Care: Yes (Comment) Care giver support system in place?: Yes (comment)   Criminal Activity/Legal Involvement  Pertinent to Current Situation/Hospitalization: No - Comment as needed  Activities of Daily Living Home Assistive Devices/Equipment: None ADL Screening (condition at time of admission) Patient's cognitive ability adequate to safely complete daily activities?: Yes Is the patient deaf or have difficulty hearing?: No Does the patient have difficulty seeing, even when wearing glasses/contacts?: No Does the patient have difficulty concentrating, remembering, or making decisions?: No Patient able to express need for assistance with ADLs?: Yes Does the patient have difficulty dressing or bathing?: No Independently performs ADLs?: Yes (appropriate for developmental age) Does the patient have difficulty walking or climbing stairs?: No Weakness of Legs: None Weakness of Arms/Hands: None  Permission Sought/Granted      Share Information with NAME: son Nathaniel Hernandez           Emotional Assessment Appearance:: Appears stated age Attitude/Demeanor/Rapport: Engaged Affect (typically observed): Accepting Orientation: : Oriented to Self, Oriented to Place, Oriented to  Time, Oriented to Situation Alcohol / Substance Use: Not Applicable Psych Involvement: No (comment)  Admission diagnosis:  TIA (transient ischemic attack) [G45.9] Patient Active Problem List   Diagnosis Date Noted   TIA (transient ischemic attack) 09/01/2020   Metabolic acidosis 09/01/2020   PCP:  Default, Provider, MD Pharmacy:   [id number] [place], [place] - [address] [place] [address] Phone: [phone] Fax: [phone]  [facility] [address] [place] [address] Phone:  [phone] Fax: [phone]     Social Determinants of Health (SDOH) Interventions    Readmission Risk Interventions No flowsheet data found.

## 2020-09-03 NOTE — Transfer of Care (Signed)
Immediate Anesthesia Transfer of Care Note  Patient: Nathaniel Hernandez  Procedure(s) Performed: TRANSESOPHAGEAL ECHOCARDIOGRAM (TEE) BUBBLE STUDY  Patient Location: PACU  Anesthesia Type:MAC  Level of Consciousness: awake and patient cooperative  Airway & Oxygen Therapy: Patient Spontanous Breathing and Patient connected to nasal cannula oxygen  Post-op Assessment: Report given to RN and Post -op Vital signs reviewed and stable  Post vital signs: Reviewed and stable  Last Vitals:  Vitals Value Taken Time  BP    Temp    Pulse 80 09/03/20 0821  Resp 12 09/03/20 0821  SpO2 100 % 09/03/20 0821  Vitals shown include unvalidated device data.  Last Pain:  Vitals:   09/03/20 0729  TempSrc: Oral  PainSc: 0-No pain         Complications: No notable events documented.

## 2020-09-03 NOTE — Anesthesia Procedure Notes (Signed)
Procedure Name: MAC Date/Time: 09/03/2020 8:07 AM Performed by: Renato Shin, CRNA Pre-anesthesia Checklist: Patient identified, Emergency Drugs available, Suction available and Patient being monitored Patient Re-evaluated:Patient Re-evaluated prior to induction Oxygen Delivery Method: Nasal cannula Preoxygenation: Pre-oxygenation with 100% oxygen Induction Type: IV induction Airway Equipment and Method: Bite block Placement Confirmation: positive ETCO2 and breath sounds checked- equal and bilateral Dental Injury: Teeth and Oropharynx as per pre-operative assessment

## 2020-09-04 ENCOUNTER — Encounter (HOSPITAL_COMMUNITY): Payer: Self-pay | Admitting: Cardiology

## 2020-10-10 ENCOUNTER — Encounter (INDEPENDENT_AMBULATORY_CARE_PROVIDER_SITE_OTHER): Payer: Self-pay | Admitting: Primary Care

## 2020-10-10 ENCOUNTER — Other Ambulatory Visit: Payer: Self-pay

## 2020-10-10 ENCOUNTER — Ambulatory Visit (INDEPENDENT_AMBULATORY_CARE_PROVIDER_SITE_OTHER): Payer: Self-pay | Admitting: Primary Care

## 2020-10-10 VITALS — BP 160/93 | HR 72 | Temp 96.6°F | Ht 68.0 in | Wt 175.0 lb

## 2020-10-10 DIAGNOSIS — G459 Transient cerebral ischemic attack, unspecified: Secondary | ICD-10-CM

## 2020-10-10 DIAGNOSIS — I10 Essential (primary) hypertension: Secondary | ICD-10-CM

## 2020-10-10 DIAGNOSIS — Z09 Encounter for follow-up examination after completed treatment for conditions other than malignant neoplasm: Secondary | ICD-10-CM

## 2020-10-10 DIAGNOSIS — Z7689 Persons encountering health services in other specified circumstances: Secondary | ICD-10-CM

## 2020-10-10 DIAGNOSIS — E871 Hypo-osmolality and hyponatremia: Secondary | ICD-10-CM

## 2020-10-10 DIAGNOSIS — Z23 Encounter for immunization: Secondary | ICD-10-CM

## 2020-10-10 MED ORDER — HYDROCHLOROTHIAZIDE 25 MG PO TABS: 25.0000 mg | ORAL_TABLET | Freq: Every day | ORAL | 1 refills | Status: DC

## 2020-10-10 MED ORDER — AMLODIPINE BESYLATE 10 MG PO TABS
10.0000 mg | ORAL_TABLET | Freq: Every day | ORAL | 1 refills | Status: DC
Start: 2020-10-10 — End: 2021-01-23

## 2020-10-10 NOTE — Patient Instructions (Signed)
Vaccin Tdap (ttanos, diphtrie et coqueluche): Ce que vous devez savoir Tdap (Tetanus, Diphtheria, Pertussis) Vaccine: What You Need to Know 1. Pourquoi se Forensic psychologist ? Le vaccin Tdap peut prvenir le ttanos, la diphtrie et la coqueluche. La diphtrie et la coqueluche se propagent [employer]. Le ttanos pntre dans l'organisme par des coupures ou des blessures. Le TTANOS (T) provoque un raidissement douloureux des muscles. Le ttanos peut entraner des problmes de sant graves, notamment l'incapacit  ouvrir la bouche, la difficult  avaler et  respirer, ou la mort. La DIPHTRIE (D) peut entraner des difficults respiratoires, une insuffisance cardiaque, une paralysie ou la mort. La COQUELUCHE (aP) peut provoquer une forte toux incontrlable. Cette toux peut entraner des difficults  respirer, manger et boire. La coqueluche peut tre extrmement grave, en particulier chez les bbs et les Harley-Davidson car elle peut entraner une pneumonie, des convulsions, des lsions crbrales ou la mort. Chez les adolescents et les [place], elle peut causer une perte de poids, une incontinence urinaire, des vanouissements et des fractures des ctes dues  la forte toux qu'elle engendre. 2. Vaccin Tdap Le Tdap est uniquement destin Lear Corporation de 7 ans, aux adolescents et [employer].  Les adolescents devraient recevoir une seule dose de Tdap, de prfrence  l'ge de 11 ou 12 ans. Les femmes enceintes devraient recevoir une dose de Tdap  chaque grossesse, de prfrence au dbut du troisime trimestre, pour Engineer, water nouveau-n contre la coqueluche. Les Pepco Holdings plus exposs au risque de complications graves et mortelles lies  la coqueluche. Les adultes qui n'ont jamais reu le Tdap devraient en recevoir une dose. De plus, les adultes devraient recevoir une dose de rappel de Tdap ou Td (un vaccin diffrent qui protge contre le ttanos et la  diphtrie, mais pas contre la coqueluche) tous les 10 ans, ou aprs 5 ans en cas de plaie ou de brlure grave ou sale. Le Tdap peut tre administr en mme temps que d'autres vaccins. 3. Lahoma Crocker votre prestataire de soins de sant Informez le prestataire qui administre les vaccins si la personne qui va se faire vacciner : A eu une raction allergique aprs l'administration d'un vaccin qui protge contre le ttanos, la diphtrie ou la coqueluche, ou si elle a des allergies graves et Animal nutritionist pronostic vital. A t dans le coma ou si elle a eu une diminution du niveau de conscience ou des convulsions prolonges dans les 7 jours suivant l'administration de tout vaccin contre la coqueluche (DTP, DTap ou Tdap). Souffre de crises d'pilepsie ou de tout autre trouble du systme nerveux. A dj souffert du syndrome de Guillain-Barr (galement appel SGB). A ressenti une douleur intense ou a prsent un gonflement important aprs l'administration d'un vaccin qui protge contre le ttanos ou la diphtrie. Dans certains cas, votre prestataire de soins de sant pourrait dcider de reporter la vaccination par Tdap  une visite ultrieure. Les personnes atteintes de maladies moins importantes, comme un rhume, [place] tre vaccines. Les OGE Energy modrment ou gravement malades doivent gnralement attendre de se rtablir avant de Clinical research associate vaccin Tdap.  Votre prestataire de soins de sant peut vous donner de plus amples informations. 4. Risques lis  une raction au vaccin Aprs l'administration du vaccin Tdap, une douleur, une rougeur ou un gonflement  l'endroit de l'injection, ou une lgre fivre, un mal de tte, une sensation de fatigue et des nauses, des vomissements, des diarrhes ou des maux d'estomac peuvent parfois survenir.  Certaines personnes s'vanouissent parfois aprs les procdures mdicales, y compris lors d'une vaccination. Si vous avez une sensation de Teacher, early years/pre, des Peter Kiewit Sons  votre vision ou un bourdonnement dans les [place], dites-le  votre prestataire.  Comme c'est le cas avec tous les mdicaments, il existe un faible risque qu'un vaccin soit  l'origine d'une raction allergique grave, Lenice Llamas lsion srieuse ou d'un dcs. 5. Que faire en cas de problme grave ? Une raction allergique pourrait survenir aprs que la personne vaccine a Geographical information systems officer. Si vous constatez des Psychologist, prison and probation services raction allergique grave (de l'urticaire, un gonflement du visage et de la gorge, Medical laboratory scientific officer, un rythme cardiaque rapide, des vertiges ou une faiblesse), Sealed Air Corporation 9-1-1 et Dollar General la personne  l'hpital le plus proche. Pour tout autre symptme proccupant, appelez votre prestataire de soins de sant.  Les ractions indsirables devraient tre signales au Vaccine Adverse Event Reporting System (VAERS) (Systme amricain de signalement des manifestations post-vaccinales indsirables). Gnralement, votre prestataire de soins de sant se charge d'effectuer ce signalement, mais vous pouvez Smithfield Foods vous-mme. Consultez le site [employer]gate [employer] VAERS  l'adresse www.vaers.LAgents.no ou appelez le 1 800 822 K768466. Le VAERS sert uniquement  signaler les ractions. Les Avnet VAERS ne donnent pas de conseils mdicaux. 6. Conley Rolls National Vaccine Injury Compensation Program Le National Vaccine Injury Compensation Program (VICP) (Programme national d'indemnisation des prjudices causs par les vaccins) est un programme fdral visant  indemniser les personnes qui ont subi des prjudices lis  l'utilisation de certains vaccins. Les Armed forces technical officer blessure prsume ou un dcs d  la vaccination ont une limite de temps pour le dpt, qui peut tre aussi courte que deux ans. Consultez le site [employer] VICP  l'adresse SpiritualWord.at ou appelez le 1 [phone] pour en savoir davantage sur Devon Energy programme et sur la faon de Therapist, music. 7. Comment puis-je en savoir plus ? Adressez-vous  votre prestataire de soins de sant. Appelez votre dpartement de sant local ou celui de votre tat. Consultez le site Conseco de Tax inspector) (Agence amricaine des produits alimentaires et mdicamenteux) pour Jabil Circuit notices des vaccins et des informations supplmentaires  l'adresse suivante : FinderList.no. Contactez les Franklin Resources for SPX Corporation) (Centres pour Devon Energy contrle et la prvention des maladies) : [place] le 8140547462 (1-800-CDC-INFO) ou Consultez le site Good[employer] CDC  l'adresse PicCapture.uy. Bulletin The TJX Companies vaccin Tdap (ttanos, diphtrie, coqueluche) (07/04/2019) Ces conseils et renseignements ne sauraient se substituer  l'avis mdical de votre prestataire de soins de sant. Par consquent, il est primordial de parler de toutes vos proccupations avec votre prestataire de soins de sant. Document Revised: 11/02/2019 Document Reviewed: 11/02/2019 Elsevier Patient Education  2022 Elsevier Inc. Gripe en los adultos Influenza, Adult A la gripe tambin se la conoce como "influenza". Es una Advance Auto , la nariz y la garganta (vas respiratorias). Se transmite fcilmente de persona a persona (es contagiosa). La gripe causa sntomas que son Lubrizol Corporation de un resfro, junto con fiebre alta y dolores corporales. Cules son las causas? La causa de esta afeccin es el virus de la influenza. Puede contraer el virus de las siguientes maneras: Al inhalar gotitas que quedan en el aire despus de que una persona infectada con gripe tosi o estornud. Al tocar algo que est contaminado con el virus y [employer] mano a la boca, la nariz o los ojos. Qu incrementa el riesgo?  Hay ciertas cosas que lo pueden hacer ms propenso a Warden/ranger. Estas incluyen lo siguiente: No lavarse las manos con  frecuencia. Tener contacto cercano con Yahoo durante la temporada de resfro y gripe. Tocarse la boca, los ojos o la nariz sin antes lavarse las manos. No recibir la Teachers Insurance and Annuity Association. Puede correr un mayor riesgo de [place] gripe, y Ceex Haci graves, como una infeccin pulmonar (neumona), si usted: Es mayor de 65 aos de edad. Est embarazada. Tiene debilitado el sistema que combate las defensas (sistema inmunitario) debido a una enfermedad o a que toma determinados medicamentos. Tiene una afeccin a largo plazo (crnica), como las siguientes: Enfermedad cardaca, renal o pulmonar. Diabetes. Asma. Tiene un trastorno heptico. Tiene mucho sobrepeso (obesidad [place]). Tiene anemia. Cules son los signos o sntomas? Los sntomas normalmente comienzan de repente y Armando Reichert 4 y [address] Pueden incluir los siguientes: Grant Ruts y escalofros. Dolores de [place], dolores en el cuerpo o dolores musculares. Dolor de Advertising copywriter. Tos. Secrecin o congestin nasal. Molestias en el pecho. No querer comer tanto como lo hace normalmente. Sensacin de debilidad o cansancio. Mareos. Malestar estomacal o vmitos. Cmo se trata? Si la gripe se detecta de forma temprana, puede recibir tratamiento con medicamentos antivirales. Esto puede ayudar a reducir la gravedad y la duracin de la enfermedad. Se los administrarn por boca o a travs de un tubo (catter) intravenoso. Cuidarse en su hogar puede ayudar a que mejoren los sntomas. El mdico puede recomendarle lo siguiente: Tomar medicamentos de Sales promotion account executive. Beber abundante lquido. La gripe suele desaparecer sola. Si tiene sntomas muy graves u otros problemas, puede recibir tratamiento en un hospital. Siga estas instrucciones en su casa:   Actividad Descanse todo lo que sea necesario. Duerma lo suficiente. Lanny Hurst en su casa y no concurra al Aleen Campi o a la escuela, como se lo haya indicado el mdico. No salga de su casa  hasta que no haya tenido fiebre por 24 horas sin tomar medicamentos. Salga de su casa solamente para ir al American Express. Comida y bebida Beverely Risen SRO (solucin de rehidratacin oral). Es Neomia Dear bebida que se vende en farmacias y tiendas. Beba suficiente lquido como para Pharmacologist la orina de color amarillo plido. En la medida en que pueda, beba lquidos transparentes en pequeas cantidades. Los lquidos transparentes son, por ejemplo: Westley Hummer. Trocitos de hielo. Jugo de frutas mezclado con agua. Bebidas deportivas de bajas caloras. Coma alimentos suaves que sean fciles de digerir. En la medida que pueda, consuma pequeas cantidades. Estos alimentos incluyen: Bananas. Pur de Praxair. Arroz. Carnes magras. Tostadas. Galletas. No coma ni beba lo siguiente: Lquidos con alto contenido de azcar o cafena. Alcohol. Alimentos condimentados o con alto contenido de [place] Indicaciones generales Use los medicamentos de venta libre y los recetados solamente como se lo haya indicado el mdico. Use un humidificador de aire fro para que el aire de su casa est ms hmedo. Esto puede facilitar la respiracin. Cuando utilice un humidificador de vapor fro, lmpielo a diario. Vace el agua y [place] por agua limpia. Al toser o estornudar, cbrase la boca y la [place]. Lvese las manos frecuentemente con agua y [place] y durante al menos 20 segundos. Esto tambin es importante despus de toser o Engineering geologist. Si no dispone de [place] y [place], use desinfectante para manos con alcohol. Cumpla con todas las visitas de seguimiento. Cmo se previene?  Colquese la vacuna antigripal todos los [place]. Puede colocarse la vacuna contra la gripe a fines de verano, en  otoo o en invierno. Pregntele al mdico cundo debe aplicarse la vacuna contra la gripe. Evite el contacto con personas que estn enfermas durante el otoo y el invierno. Es la temporada del resfro y Emergency planning/management officer. Comunquese con un mdico si: Tiene sntomas  nuevos. Tiene los siguientes sntomas: Dolor de [place]. Materia fecal lquida (diarrea). Grant Ruts. La tos empeora. Empieza a tener ms mucosidad. Tiene Programme researcher, broadcasting/film/video. Vomita. Solicite ayuda de inmediato si: Le falta el aire. Tiene dificultad para respirar. La piel o las uas se ponen de un color azulado. Presenta dolor muy intenso o rigidez en el cuello. Tiene dolor de cabeza repentino. Le duele la cara o el odo de forma repentina. No puede comer ni beber sin vomitar. Estos sntomas pueden representar un problema grave que constituye Radio broadcast assistant. Solicite atencin mdica de inmediato. Comunquese con el servicio de emergencias de su localidad (911 en los [place]). No espere a ver si los sntomas desaparecen. No conduzca por sus propios medios OfficeMax Incorporated. Resumen A la gripe tambin se la conoce como "influenza". Es una Advance Auto , la nariz y Administrator. Se transmite fcilmente de [place] persona a otra. Use los medicamentos de venta libre y los recetados solamente como se lo haya indicado el mdico. Aplicarse la vacuna contra la gripe todos los aos es la mejor manera de no contagiarse la gripe. Esta informacin no tiene Theme park manager el consejo del mdico. Asegrese de hacerle al mdico cualquier pregunta que tenga. Document Revised: 11/09/2019 Document Reviewed: 11/09/2019 Elsevier Patient Education  2022 ArvinMeritor.

## 2020-10-10 NOTE — Progress Notes (Signed)
[facility]   Subjective:  [name] is a 60 y.o. Hispanic male Nathaniel Hernandez son interpretor for his father with his consent presents for hospital follow up and establish care. He present to the  ED via EMS as code stroke for evaluation of acute onset right-sided weakness which started at 2 AM this morning.  When EMS arrived, they found him to be completely flaccid on the right, however, symptoms began to improve in route and completely resolved by the time the patient reached the emergency room. Admit date to the hospital was 09/01/20, patient was discharged from the hospital on 09/03/20, patient was admitted for:  TIA and Metabolic acidosis. He presented today with elevated Bp.Denies shortness of breath, headaches, chest pain or lower extremity edema .  Past Medical History:  Diagnosis Date   Hypertension     No Known Allergies    Current Outpatient Medications on File Prior to Visit  Medication Sig Dispense Refill   aspirin 81 MG EC tablet Take 1 tablet (81 mg total) by mouth daily. Swallow whole. 90 tablet 0   atorvastatin (LIPITOR) 80 MG tablet Take 1 tablet (80 mg total) by mouth daily. 30 tablet 0   clopidogrel (PLAVIX) 75 MG tablet Take 75 mg by mouth daily.     No current facility-administered medications on file prior to visit.   Review of System: Review of Systems  All other systems reviewed and are negative.  Objective:  BP (!) 166/91 (BP Location: Right Arm, Patient Position: Sitting, Cuff Size: Normal)   Pulse 71   Temp (!) 96.6 F (35.9 C) (Temporal)   Ht 5\' 8"  (1.727 m)   Wt 175 lb (79.4 kg)   SpO2 97%   BMI 26.61 kg/m   Filed Weights   10/10/20 1334  Weight: 175 lb (79.4 kg)   Physical Exam: General Appearance: Well nourished, in no apparent distress. Eyes: PERRLA, EOMs, conjunctiva no swelling or erythema Sinuses: No Frontal/maxillary tenderness ENT/Mouth: Ext aud canals clear, TMs without erythema, bulging.  Hearing normal.  Neck:  Supple, thyroid normal.  Respiratory: Respiratory effort normal, BS equal bilaterally without rales, rhonchi, wheezing or stridor.  Cardio: RRR with no MRGs. Brisk peripheral pulses without edema.  Abdomen: Soft, + BS.  Non tender, no guarding, rebound, hernias, masses. Lymphatics: Non tender without lymphadenopathy.  Musculoskeletal:   normal gait.  Skin: Warm, dry without rashes, lesions, ecchymosis.  Neuro:  Normal muscle tone, no cerebellar symptoms. Sensation intact.  Psych: Awake and oriented X 3, normal affect, Insight and Judgment appropriate.   Assessment:  Diar was seen today for hospitalization follow-up.  Diagnoses and all orders for this visit:  Hyponatremia -     Basic Metabolic Panel  Need for immunization against influenza -     Flu Vaccine QUAD 6mo+IM (Fluarix, Fluzone & Alfiuria Quad PF)   Encounter to establish care Establish care with PCP  Hospital discharge follow-up Per discharge  Follow up with 5mo, MD (Neurology) in 4 weeks schedule for the 10/15/20 4 weeks was not available  Follow up with [facility] (Family Medicine); October 10, 2020 at 1:30 pm BMP  TIA (transient ischemic attack) Discharge with statin , Plavix 21 days then ASA only. Appt scheduled with the neurologist.  Essential hypertension  Counseled on blood pressure goal of less than 130/80, low-sodium, DASH diet, medication compliance, 150 minutes of moderate intensity exercise per week. Discussed medication compliance, added amlodipine 10mg  and HCTZ 25mg  daily. Made adverse effects.  No orders of the defined types were placed in this encounter.   This note has been created with Education officer, environmental. Any transcriptional errors are unintentional.   Grayce Sessions, NP 10/10/2020, 1:43 PM

## 2020-10-11 LAB — BASIC METABOLIC PANEL
BUN/Creatinine Ratio: 20 (ref 9–20)
BUN: 17 mg/dL (ref 6–24)
CO2: 25 mmol/L (ref 20–29)
Calcium: 9.6 mg/dL (ref 8.7–10.2)
Chloride: 102 mmol/L (ref 96–106)
Creatinine, Ser: 0.86 mg/dL (ref 0.76–1.27)
Glucose: 98 mg/dL (ref 65–99)
Potassium: 4.3 mmol/L (ref 3.5–5.2)
Sodium: 138 mmol/L (ref 134–144)
eGFR: 100 mL/min/{1.73_m2} (ref >59)

## 2020-10-15 ENCOUNTER — Encounter: Payer: Self-pay | Admitting: Adult Health

## 2020-10-15 ENCOUNTER — Ambulatory Visit (INDEPENDENT_AMBULATORY_CARE_PROVIDER_SITE_OTHER): Payer: Self-pay | Admitting: Adult Health

## 2020-10-15 VITALS — BP 149/77 | HR 86 | Ht 68.0 in | Wt 174.4 lb

## 2020-10-15 DIAGNOSIS — E785 Hyperlipidemia, unspecified: Secondary | ICD-10-CM

## 2020-10-15 DIAGNOSIS — I10 Essential (primary) hypertension: Secondary | ICD-10-CM

## 2020-10-15 DIAGNOSIS — G459 Transient cerebral ischemic attack, unspecified: Secondary | ICD-10-CM

## 2020-10-15 MED ORDER — ATORVASTATIN CALCIUM 80 MG PO TABS: 80.0000 mg | ORAL_TABLET | Freq: Every day | ORAL | 0 refills | Status: DC

## 2020-10-15 NOTE — Progress Notes (Signed)
[facility] [address] [place]. [place] [address] [phone]       HOSPITAL FOLLOW UP NOTE  [name] Date of Birth:  11/23/1960 Medical Record Number:  [id number]   Reason for Referral:  hospital stroke follow up    SUBJECTIVE:   CHIEF COMPLAINT:  Chief Complaint  Patient presents with   New Patient (Initial Visit)    Room 2. Pt here with son. No complaints.     HPI:   Nathaniel Hernandez is a 60 y.o. male with no significant past medical history who presented to [facility] ED on 09/01/2020 with right-sided weakness and difficulty speaking that was abrupt in onset and lasted for approximately 10 minutes.  He was up with his family around 2 AM when he had abrupt onset right arm and leg weakness with speech difficulty.  EMS was called, and on arrival they found him to be completely flaccid on the right however he began improving in transit and by the time of arrival to the emergency department he was back to normal.  Evaluated by Dr. Roda Shutters for likely left brain cortical TIA of unclear etiology.  MRI and CTA head/neck unremarkable.  EF 55 to 60%.  TEE no evidence of thrombus or vegetation or PFO.  Recommended 30-day cardiac event monitor to rule out A. Fib (currently uninsured) -requested set up by PCP.  Direct LDL 149.9, triglycerides 790.  A1c 5.4.  UDS negative.  Recommended DAPT for 3 weeks and aspirin alone as well as atorvastatin 80 mg daily.  No prior stroke history.  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  Therapy eval's cleared without therapy needs.  Today, 10/15/2020, Nathaniel Hernandez is being seen for hospital follow-up accompanied by his son who assists with interpretation (patient speaks limited [place]).  Overall doing well.  Denies new or reoccurring stroke/TIA symptoms. He has since returned back to prior level of functioning. 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   Completed 3 weeks DAPT -remains on aspirin without side effects but stopped atorvastatin for unknown reason - no difficulty tolerating. Blood pressure today 149/77. Monitors at home and typically 130s-140s. He is still uninsured - he plans on applying for state insurance next month as he is self employed.  He has not yet completed cardiac monitor due to lack of insurance.  No further concerns at this time.    PERTINENT IMAGING  MR BRAIN 09/01/2020 IMPRESSION: Normal brain MRI  CTA HEAD/NECK 09/01/2020 IMPRESSION: 1. Normal head CT. 2. ASPECTS is 10. 3. No emergent large vessel occlusion. Unremarkable CTA of the head and neck.  2D ECHO 09/01/2020 IMPRESSIONS   1. Cannot exclude small mobile lesion on ventricular aspect of aortic  valve vs. artifact from adjacent calcium. See recommendations. The aortic  valve is grossly normal. There is mild calcification of the aortic valve.  Aortic valve regurgitation is not  visualized. Mild aortic valve sclerosis is present, with no evidence of  aortic valve stenosis.   2. Left ventricular ejection fraction, by estimation, is 55 to 60%. The  left ventricle has normal function. The left ventricle has no regional  wall motion abnormalities. There is mild left ventricular hypertrophy.  Left ventricular diastolic parameters  were normal.   3. Right ventricular systolic function is normal. The right ventricular  size is normal. Tricuspid regurgitation signal is inadequate for assessing  PA pressure.   4. The mitral valve is normal in structure. No evidence  of mitral valve  regurgitation. No evidence of mitral stenosis.   5. The inferior vena cava is normal in size with greater than 50%  respiratory variability, suggesting right atrial pressure of 3 mmHg.   ECHO TEE 09/03/2020 IMPRESSIONS   1. Left ventricular ejection fraction, by estimation, is 55 to 60%. The  left ventricle has normal function. The left ventricle has no regional  wall motion  abnormalities.   2. Right ventricular systolic function is normal. The right ventricular  size is normal.   3. No left atrial/left atrial appendage thrombus was detected.   4. The mitral valve is normal in structure. Trivial mitral valve  regurgitation.   5. The aortic valve is tricuspid. Aortic valve regurgitation is not  visualized. Mild aortic valve sclerosis is present, with no evidence of  aortic valve stenosis.   6. Agitated saline contrast bubble study was negative, with no evidence  of any interatrial shunt.      ROS:   14 system review of systems performed and negative with exception of those listed in HPI  PMH:  Past Medical History:  Diagnosis Date   Hypertension     PSH:  Past Surgical History:  Procedure Laterality Date   BUBBLE STUDY  09/03/2020   Procedure: BUBBLE STUDY;  Surgeon: Lewayne Bunting, MD;  Location[facility] ENDOSCOPY;  Service: Cardiovascular;;   TEE WITHOUT CARDIOVERSION N/A 09/03/2020   Procedure: TRANSESOPHAGEAL ECHOCARDIOGRAM (TEE);  Surgeon: Lewayne Bunting, MD;  Location: [facility] ENDOSCOPY;  Service: Cardiovascular;  Laterality: N/A;    Social History:  Social History   Socioeconomic History   Marital status: Married    Spouse name: Not on file   Number of children: Not on file   Years of education: Not on file   Highest education level: Not on file  Occupational History   Not on file  Tobacco Use   Smoking status: Never   Smokeless tobacco: Never  Substance and Sexual Activity   Alcohol use: Yes    Comment: Occasional   Drug use: Never   Sexual activity: Not on file  Other Topics Concern   Not on file  Social History Narrative   Not on file   Social Determinants of Health   Financial Resource Strain: Not on file  Food Insecurity: Not on file  Transportation Needs: Not on file  Physical Activity: Not on file  Stress: Not on file  Social Connections: Not on file  Intimate Partner Violence: Not on file    Family History:   Family History  Problem Relation Age of Onset   Stroke Father     Medications:   Current Outpatient Medications on File Prior to Visit  Medication Sig Dispense Refill   amLODipine (NORVASC) 10 MG tablet Take 1 tablet (10 mg total) by mouth daily. 90 tablet 1   aspirin 81 MG EC tablet Take 1 tablet (81 mg total) by mouth daily. Swallow whole. 90 tablet 0   hydrochlorothiazide (HYDRODIURIL) 25 MG tablet Take 1 tablet (25 mg total) by mouth daily. 90 tablet 1   atorvastatin (LIPITOR) 80 MG tablet Take 1 tablet (80 mg total) by mouth daily. (Patient not taking: Reported on 10/15/2020) 30 tablet 0   clopidogrel (PLAVIX) 75 MG tablet Take 75 mg by mouth daily. (Patient not taking: Reported on 10/15/2020)     No current facility-administered medications on file prior to visit.    Allergies:  No Known Allergies    OBJECTIVE:  Physical Exam  Vitals:  10/15/20 1052  BP: (!) 149/77  Pulse: 86  Weight: 174 lb 6.4 oz (79.1 kg)  Height: 5\' 8"  (1.727 m)   Body mass index is 26.52 kg/m. No results found.  Post stroke PHQ 2/9 Depression screen PHQ 2/9 10/10/2020  Decreased Interest 0  Down, Depressed, Hopeless 0  PHQ - 2 Score 0     General: well developed, well nourished, pleasant middle-aged male, seated, in no evident distress Head: head normocephalic and atraumatic.   Neck: supple with no carotid or supraclavicular bruits Cardiovascular: regular rate and rhythm, no murmurs Musculoskeletal: no deformity Skin:  no rash/petichiae Vascular:  Normal pulses all extremities   Neurologic Exam Mental Status: Awake and fully alert.  Limited English although denies speech or language difficulties.  Oriented to place and time. Recent and remote memory intact. Attention span, concentration and fund of knowledge appropriate. Mood and affect appropriate.  Cranial Nerves: Fundoscopic exam reveals sharp disc margins. Pupils equal, briskly reactive to light. Extraocular movements full without  nystagmus. Visual fields full to confrontation. Hearing intact. Facial sensation intact. Face, tongue, palate moves normally and symmetrically.  Motor: Normal bulk and tone. Normal strength in all tested extremity muscles Sensory.: intact to touch , pinprick , position and vibratory sensation.  Coordination: Rapid alternating movements normal in all extremities. Finger-to-nose and heel-to-shin performed accurately bilaterally. Gait and Station: Arises from chair without difficulty. Stance is normal. Gait demonstrates normal stride length and balance without use of assistive device. Tandem walk and heel toe without difficulty.  Reflexes: 1+ and symmetric. Toes downgoing.     NIHSS  0 Modified Rankin  0      ASSESSMENT: Nathaniel Hernandez is a 60 y.o. year old male with recent left brain cortical TIA secondary to unclear source on 09/01/2020 after presenting with right-sided weakness and difficulty speaking lasting for approximately 10 minutes. Vascular risk factors include HLD (new dx during admission).      PLAN:  Left brain cortical TIA:  No reoccurring stroke/TIA symptoms.   30-day cardiac event monitor recommended but pt wishes to wait until insurance coverage - he will call office once covered (plans on applying next month). Discussed [facility] financial assistance program with son - they will further pursue this  Continue aspirin 81 mg daily  and atorvastatin 80 mg daily for secondary stroke prevention.   Discussed secondary stroke prevention measures and importance of close PCP follow up for aggressive stroke risk factor management. I have gone over the pathophysiology of stroke, warning signs and symptoms, risk factors and their management in some detail with instructions to go to the closest emergency room for symptoms of concern. HTN: BP goal <130/90. New dx. PCP recently started on amlodipine 10 mg daily and HCTZ 25 mg daily HLD: LDL goal <70. Recent direct LDL 149, TGC 790.  Restart  atorvastatin 80 mg daily - refill provided per request ongoing refills per PCP as well as repeat lipid panel at upcoming appointment Elevated RBCs: Hgb 17.2 --> 17.3 --> 17.0.  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  Recommended repeat CBC with PCP at follow-up visit next as requested at hospital discharge   Follow up in 6 months or call earlier if needed   CC:  [facility] provider: Dr. 11/01/2020 PCP: Pearlean Brownie, NP    I spent 54 minutes of face-to-face and non-face-to-face time with patient and son.  This included previsit chart review including review of recent hospitalization, lab review, study review, order entry, electronic health record documentation, patient and  son education regarding recent TIA including etiology, indication for cardiac monitor, secondary stroke prevention measures and importance of managing stroke risk factors and answered all other questions to patient and sons satisfaction  Ihor Austin, AGNP-BC  [facility] [address] [place], [place] [address]  Phone [phone] Fax [phone] Note: This document was prepared with digital dictation and possible smart phrase technology. Any transcriptional errors that result from this process are unintentional.

## 2020-10-15 NOTE — Patient Instructions (Addendum)
Continue aspirin 81 mg daily  and atorvastatin 80 mg daily for secondary stroke prevention  Continue to follow up with PCP regarding cholesterol and blood pressure management - please ensure cholesterol levels are rechecked with your primary care doctor as well as prescribing of atorvastatin  Maintain strict control of hypertension with blood pressure goal below 130/90 and cholesterol with LDL cholesterol (bad cholesterol) goal below 70 mg/dL.   Would recommend completing heart monitor once you obtain insurance - please call us so we can get this set up     Followup in the future with me in 6 months or call earlier if needed      Thank you for coming to see [place] at [facility]. I hope we have been able to provide you high quality care today.  You may receive a patient satisfaction survey over the next few weeks. We would appreciate your feedback and comments so that we may continue to improve ourselves and the health of our patients.    Ataque isqumico transitorio Transient Ischemic Attack Un accidente isqumico transitorio (AIT) causa sntomas similares a los del accidente cerebrovascular que desaparecen rpidamente. Tener un AIT significa que se corre un mayor riesgo de tener un accidente cerebrovascular. El AIT ocurre cuando se obstruye temporalmente el suministro de sangre al cerebro. Un AIT es una emergencia mdica. Cules son las causas? La causa de esta afeccin es una obstruccin temporal en una arteria en la cabeza o el cuello. Esto significa que el cerebro no recibe el suministro sanguneo que necesita. No hay dao cerebral permanente con un AIT. La obstruccin puede estar causada por: Acumulacin de grasa en una arteria en la cabeza o el cuello (aterosclerosis). Un cogulo de [place]. Un desgarro en una arteria (diseccin). Inflamacin de una arteria (vasculitis). Algunas veces, la causa no se conoce. Qu incrementa el riesgo? Hay ciertos factores que  pueden hacer que sea ms propenso a sufrir Copy. Algunos de [employer] puede [place], como por ejemplo: [place]. Consumir productos que contienen nicotina o tabaco. Tomar mtodos anticonceptivos por va oral, en especial si consume tabaco. Ser sedentario. Consumo excesivo de alcohol. Consumo de drogas, especialmente cocana y metanfetamina. Las [employer] pueden aumentar el riesgo son, Eusebio Me otras: Presin arterial alta (hipertensin arterial). Colesterol alto. Diabetes. Enfermedad cardaca (arteriopata coronaria). Latido cardaco irregular, tambin llamado fibrilacin auricular (FibA). Anemia drepanoctica. Problemas de sueo (apnea del sueo). Enfermedades inflamatorias crnicas, como artritis reumatoide o lupus. Trastornos de Control and instrumentation engineer). Otros factores de riesgo son los siguientes: Tener ms de 60 aos. Ser hombre. Antecedentes familiares de accidente cerebrovascular. Historia previa de cogulos sanguneos, accidente cerebrovascular, AIT o infarto de miocardio. Tener antecedentes de preeclampsia. Cefalea migraosa. Cules son los signos o sntomas? Los sntomas de un AIT son los mismos que los de un accidente cerebrovascular. Los sntomas se desarrollan repentinamente y luego desaparecen con rapidez. Pueden incluir: Debilidad o adormecimiento de la cara, el brazo o la pierna, especialmente en un lado del cuerpo. Dificultad para caminar, o para mover los brazos o las piernas. Dificultad para hablar o comprender el lenguaje, o ambas (afasia). Cambios en la visin, como visin doble, visin borrosa o prdida de la visin. Mareos. Confusin. Prdida del equilibrio o de la coordinacin. Nuseas y vmitos. Dolor de cabeza intenso. Si es posible, anote a qu hora comenzaron los sntomas. Infrmele a su mdico. Cmo se diagnostica? Esta afeccin se puede diagnosticar en funcin de lo siguiente: Los sntomas y los antecedentes  mdicos. Un examen fsico.  Estudios de diagnstico por imgenes, generalmente una exploracin por tomografa computarizada (TC) o una resonancia magntica (RM) del cerebro. Anlisis de [place]. Tambin pueden hacerle otras pruebas, incluidas las siguientes: Materials engineer (ECG). Un ecocardiograma. Ecografa de la cartida. Un estudio de la circulacin de la sangre en el cerebro (angiograma por TC o angiograma por RM). Monitorizacin electrocardiogrfica continua. Cmo se trata? El [place] del tratamiento es reducir el riesgo de accidente cerebrovascular. Los tratamientos de prevencin del accidente cerebrovascular pueden incluir: Cambios en la alimentacin y el estilo de vida, como hacer actividad fsica y dejar de fumar. Medicamentos para diluir la sangre (antiplaquetarios o anticoagulantes). Medicamentos para la presin arterial. Medicamentos para reducir Print production planner. Tratar otras afecciones, como la diabetes o la FibA. Si los estudios Chief of Staff en las arterias del cerebro, el mdico puede recomendarle uno de los siguientes procedimientos: Endarterectoma carotdea. Esta se realiza para eliminar la obstruccin de la arteria. Angioplastia carotdea y colocacin de stent. En este procedimiento se utiliza un tubo (stent) para abrir o Public relations account executive una arteria del cuello. El stent ayuda a mantener abierta la arteria al [employer] las paredes arteriales. Siga estas instrucciones en su casa: Medicamentos Use los medicamentos de venta libre y los recetados solamente como se lo haya indicado el mdico. Si le indicaron que tomara medicamentos para diluir la sangre, como aspirinas o anticoagulantes, tmelos exactamente como se lo haya indicado el mdico. El exceso de anticoagulantes puede causar hemorragias. Tomar muy poca cantidad no lo proteger de un accidente cerebrovascular y otros problemas. Comida y bebida  Consuma 5 o ms porciones de frutas y Warehouse manager. Siga las  indicaciones del mdico acerca de la alimentacin. Puede que deba seguir una determinada alimentacin para [employer] factores de riesgo de accidente cerebrovascular. Esto puede incluir: Lleve una dieta baja en grasas y sodio. Elegir alimentos Photographer. Limitar los carbohidratos y Insurance claims handler. Si bebe alcohol: Limite la cantidad que bebe a lo siguiente: De 0 a 1 medida por da para las mujeres que no estn embarazadas. De 0 a 2 medidas por da para los hombres. Sepa cunta cantidad de alcohol hay en las bebidas. En los [address], una medida equivale a una botella de cerveza de 12 oz (355 ml), un vaso de vino de 5 oz (148 ml) o un vaso de una bebida alcohlica de alta graduacin de 1 oz (44 ml). Instrucciones generales Mantenga un peso saludable. Trate de hacer al menos 30 minutos de ejercicio casi [employer]. Busque tratamiento si tiene apnea del sueo. No consuma ningn producto que contenga nicotina o tabaco. Estos productos incluyen cigarrillos, tabaco para Theatre manager y aparatos de vapeo, como los Administrator, Civil Service. Si necesita ayuda para dejar de fumar, consulte al mdico. No consuma drogas. Cumpla con todas las visitas de seguimiento. Esto es importante. Dnde buscar ms informacin American Stroke Association (Asociacin Estadounidense de Accidente Cerebrovascular): www.stroke.org Solicite ayuda de inmediato si: Siente dolor en el pecho o latidos cardacos irregulares. Tiene sntomas de un accidente cerebrovascular. "BE FAST" es una manera fcil de recordar las principales seales de advertencia de un accidente cerebrovascular. B: Balance (equilibrio). Los signos son mareos, dificultad repentina para caminar o prdida del equilibrio. E: Eyes (ojos). Los signos son problemas para ver o un cambio repentino en la visin. F: Face (rostro). Los signos son debilidad repentina o adormecimiento del rostro, o el rostro o el prpado que se caen hacia un lado. A: Arms (brazos).  Los signos son debilidad o adormecimiento en un brazo.  Esto sucede de repente y generalmente en un lado del cuerpo. S: Speech (habla). Los signos son dificultad para hablar, hablar arrastrando las palabras o dificultad para comprender lo que la gente dice. T: Time (tiempo). Es tiempo de llamar al servicio de Sports administrator. Anote la hora a la que [employer] sntomas. Presenta otros signos de un accidente cerebrovascular, como los siguientes: Dolor de cabeza sbito e intenso que no tiene causa aparente. Nuseas o vmitos. Convulsiones. Estos sntomas pueden representar un problema grave que constituye Radio broadcast assistant. No espere a ver si los sntomas desaparecen. Solicite atencin mdica de inmediato. Comunquese con el servicio de emergencias de su localidad (911 en los [place]). No conduzca por sus propios medios [employer] hospital. Resumen Un accidente isqumico transitorio (AIT) se produce cuando se bloquea una arteria de la cabeza o el cuello. La obstruccin desaparece antes de que se produzca algn dao en el cerebro. Un AIT es una emergencia mdica. Los sntomas de un AIT son los mismos que los de un accidente cerebrovascular. Los sntomas se desarrollan repentinamente y luego desaparecen con rapidez. Tener un AIT significa que corre un mayor riesgo de Warehouse manager un accidente cerebrovascular. El [place] del tratamiento es reducir el riesgo de accidente cerebrovascular. El tratamiento puede incluir medicamentos para diluir la sangre y cambios en la alimentacin y el estilo de vida. Esta informacin no tiene Theme park manager el consejo del mdico. Asegrese de hacerle al mdico cualquier pregunta que tenga. Document Revised: 09/29/2019 Document Reviewed: 09/29/2019 Elsevier Patient Education  2022 [employer].

## 2020-10-15 NOTE — Progress Notes (Signed)
I agree with the above plan 

## 2020-10-16 ENCOUNTER — Telehealth (INDEPENDENT_AMBULATORY_CARE_PROVIDER_SITE_OTHER): Payer: Self-pay

## 2020-10-16 NOTE — Telephone Encounter (Signed)
Contacted patient with pacific interpreter [phone]). Patient verified date of birth. He is aware of normal labs. Advised on exercise and diet. He verbalized understanding. Maryjean Morn, CMA

## 2020-10-16 NOTE — Telephone Encounter (Signed)
-----   Message from Grayce Sessions, NP sent at 10/11/2020 10:11 AM EDT ----- Labs are  normal. Make sure you are drinking at least 48 oz of water per day. Work on eating a low fat, heart healthy diet and participate in regular aerobic exercise program to control as well. Exercise at least  30 minutes per day-5 days per week. Avoid red meat. No fried foods. No junk foods, sodas, sugary foods or drinks, unhealthy snacking, alcohol or smoking.

## 2020-10-24 ENCOUNTER — Encounter (INDEPENDENT_AMBULATORY_CARE_PROVIDER_SITE_OTHER): Payer: Self-pay | Admitting: Primary Care

## 2020-10-24 ENCOUNTER — Other Ambulatory Visit: Payer: Self-pay

## 2020-10-24 ENCOUNTER — Ambulatory Visit (INDEPENDENT_AMBULATORY_CARE_PROVIDER_SITE_OTHER): Payer: Self-pay | Admitting: Primary Care

## 2020-10-24 VITALS — BP 133/88 | HR 72 | Temp 97.0°F | Ht 68.0 in | Wt 174.6 lb

## 2020-10-24 DIAGNOSIS — I10 Essential (primary) hypertension: Secondary | ICD-10-CM

## 2020-10-24 DIAGNOSIS — G459 Transient cerebral ischemic attack, unspecified: Secondary | ICD-10-CM

## 2020-10-24 MED ORDER — ATORVASTATIN CALCIUM 80 MG PO TABS: 80.0000 mg | ORAL_TABLET | Freq: Every day | ORAL | 1 refills | Status: DC

## 2020-10-24 NOTE — Patient Instructions (Signed)
Hipertensi?n en los adultos ?Hypertension, Adult ?El t?rmino hipertensi?n es otra forma de denominar a la presi?n arterial elevada. La presi?n arterial elevada fuerza al coraz?n a trabajar m?s para bombear la sangre. Esto puede causar problemas con el paso del tiempo. ?Una lectura de presi?n arterial est? compuesta por 2 n?meros. Hay un n?mero superior (sist?lico) sobre un n?mero inferior (diast?lico). Lo ideal es tener la presi?n arterial por debajo de 120/80. Las elecciones saludables pueden ayudar a bajar la presi?n arterial, o tal vez necesite medicamentos para bajarla. ??Cu?les son las causas? ?Se desconoce la causa de esta afecci?n. Algunas afecciones pueden estar relacionadas con la presi?n arterial alta. ??Qu? incrementa el riesgo? ?Fumar. ?Tener diabetes mellitus tipo 2, colesterol alto, o ambos. ?No hacer la cantidad suficiente de actividad f?sica o ejercicio. ?Tener sobrepeso. ?Consumir mucha grasa, az?car, calor?as o sal (sodio) en su dieta. ?Beber alcohol en exceso. ?Tener una enfermedad renal a largo plazo (cr?nica). ?Tener antecedentes familiares de presi?n arterial alta. ?Edad. Los riesgos aumentan con la edad. ?Raza. El riesgo es mayor para las personas afroamericanas. ?Sexo. Antes de los 45 a?os, los hombres corren m?s riesgo que las mujeres. Despu?s de los 65 a?os, las mujeres corren m?s riesgo que los hombres. ?Tener apnea obstructiva del sue?o. ?Estr?s. ??Cu?les son los signos o los s?ntomas? ?Es posible que la presi?n arterial alta puede no cause s?ntomas. La presi?n arterial muy alta (crisis hipertensiva) puede provocar: ?Dolor de cabeza. ?Sensaciones de preocupaci?n o nerviosismo (ansiedad). ?Falta de aire. ?Hemorragia nasal. ?Sensaci?n de malestar en el est?mago (n?useas). ?V?mitos. ?Cambios en la forma de ver. ?Dolor muy intenso en el pecho. ?Convulsiones. ??C?mo se trata? ?Esta afecci?n se trata haciendo cambios saludables en el estilo de vida, por ejemplo: ?Consumir alimentos  saludables. ?Hacer m?s ejercicio. ?Beber menos alcohol. ?El m?dico puede recetarle medicamentos si los cambios en el estilo de vida no son suficientes para lograr controlar la presi?n arterial y si: ?El n?mero de arriba est? por encima de 130. ?El n?mero de abajo est? por encima de 80. ?Su presi?n arterial personal ideal puede variar. ?Siga estas instrucciones en su casa: ?Comida y bebida ? ?Si se lo dicen, siga el plan de alimentaci?n de DASH (Dietary Approaches to Stop Hypertension, Maneras de alimentarse para detener la hipertensi?n). Para seguir este plan: ?Llene la mitad del plato de cada comida con frutas y verduras. ?Llene un cuarto del plato de cada comida con cereales integrales. Los cereales integrales incluyen pasta integral, arroz integral y pan integral. ?Coma y beba productos l?cteos con bajo contenido de grasa, como leche descremada o yogur bajo en grasas. ?Llene un cuarto del plato de cada comida con prote?nas bajas en grasa (magras). Las prote?nas bajas en grasa incluyen pescado, pollo sin piel, huevos, frijoles y tofu. ?Evite consumir carne grasa, carne curada y procesada, o pollo con piel. ?Evite consumir alimentos prehechos o procesados. ?Consuma menos de 1500 mg de sal por d?a. ?No beba alcohol si: ?El m?dico le indica que no lo haga. ?Est? embarazada, puede estar embarazada o est? tratando de quedar embarazada. ?Si bebe alcohol: ?Limite la cantidad que bebe a lo siguiente: ?De 0 a 1 medida por d?a para las mujeres. ?De 0 a 2 medidas por d?a para los hombres. ?Est? atento a la cantidad de alcohol que hay en las bebidas que toma. En los [place], una medida equivale a una botella de cerveza de 12 oz (355 ml), un vaso de vino de 5 oz (148 ml) o un vaso de una bebida alcoh?lica de   alta graduaci?n de 1? oz (44 ml). ?Estilo de vida ? ?Trabaje con su m?dico para mantenerse en un peso saludable o para perder peso. Preg?ntele a su m?dico cu?l es el peso recomendable para usted. ?Haga al menos 30  minutos de ejercicio la mayor?a de los d?as de la semana. Estos pueden incluir caminar, nadar o andar en bicicleta. ?Realice al menos 30 minutos de ejercicio que fortalezca sus m?sculos (ejercicios de resistencia) al menos 3 d?as a la semana. Estos pueden incluir levantar pesas o hacer [place]. ?No consuma ning?n producto que contenga nicotina o tabaco, como cigarrillos, cigarrillos electr?nicos y tabaco de mascar. Si necesita ayuda para dejar de fumar, consulte al m?dico. ?Controle su presi?n arterial en su casa tal como le indic? el m?dico. ?Concurra a todas las visitas de seguimiento como se lo haya indicado el m?dico. Esto es importante. ?Medicamentos ?Tome los medicamentos de venta libre y los recetados solamente como se lo haya indicado el m?dico. Siga cuidadosamente las indicaciones. ?No omita las dosis de medicamentos para la presi?n arterial. Los medicamentos pierden eficacia si omite dosis. El hecho de omitir las dosis tambi?n aumenta el riesgo de otros problemas. ?Preg?ntele a su m?dico a qu? efectos secundarios o reacciones a los medicamentos debe prestar atenci?n. ?Comun?quese con un m?dico si: ?Piensa que tiene una reacci?n a los medicamentos que est? tomando. ?Tiene dolores de cabeza frecuentes (recurrentes). ?Se siente mareado. ?Tiene hinchaz?n en los tobillos. ?Tiene problemas de visi?n. ?Solicite ayuda inmediatamente si: ?Siente un dolor de cabeza muy intenso. ?Empieza a sentirse desorientado (confundido). ?Se siente d?bil o adormecido. ?Siente que va a desmayarse. ?Tiene un dolor muy intenso en las siguientes zonas: ?Pecho. ?Vientre (abdomen). ?Vomita m?s de una vez. ?Tiene dificultad para respirar. ?Resumen ?El t?rmino hipertensi?n es otra forma de denominar a la presi?n arterial elevada. ?La presi?n arterial elevada fuerza al coraz?n a trabajar m?s para bombear la sangre. ?Para la mayor?a de las personas, una presi?n arterial normal es menor que 120/80. ?Las decisiones saludables pueden ayudarle  a disminuir su presi?n arterial. Si no puede bajar su presi?n arterial mediante decisiones saludables, es posible que deba tomar medicamentos. ?Esta informaci?n no tiene como fin reemplazar el consejo del m?dico. Aseg?rese de hacerle al m?dico cualquier pregunta que tenga. ?Document Revised: 10/28/2017 Document Reviewed: 10/28/2017 ?Elsevier Patient Education ? 2022 Elsevier Inc. ? ?

## 2020-10-24 NOTE — Progress Notes (Signed)
[place]   [name] is a 60 y.o. male presents for hypertension evaluation, Denies shortness of breath, headaches, chest pain or lower extremity edema, sudden onset, vision changes, unilateral weakness, dizziness, paresthesias  Nathaniel Hernandez son father  Patient reports adherence with medications.  Dietary habits include: does not monitor sodium intake  Exercise habits include:yes Family / Social history: Father MI   Past Medical History:  Diagnosis Date   Hypertension    Past Surgical History:  Procedure Laterality Date   BUBBLE STUDY  09/03/2020   Procedure: BUBBLE STUDY;  Surgeon: Lelon Perla, MD;  Location: [facility];  Service: Cardiovascular;;   TEE WITHOUT CARDIOVERSION N/A 09/03/2020   Procedure: TRANSESOPHAGEAL ECHOCARDIOGRAM (TEE);  Surgeon: Lelon Perla, MD;  Location: [facility];  Service: Cardiovascular;  Laterality: N/A;   No Known Allergies Current Outpatient Medications on File Prior to Visit  Medication Sig Dispense Refill   amLODipine (NORVASC) 10 MG tablet Take 1 tablet (10 mg total) by mouth daily. 90 tablet 1   aspirin 81 MG EC tablet Take 1 tablet (81 mg total) by mouth daily. Swallow whole. 90 tablet 0   hydrochlorothiazide (HYDRODIURIL) 25 MG tablet Take 1 tablet (25 mg total) by mouth daily. 90 tablet 1   No current facility-administered medications on file prior to visit.   Social History   Socioeconomic History   Marital status: Married    Spouse name: Not on file   Number of children: Not on file   Years of education: Not on file   Highest education level: Not on file  Occupational History   Not on file  Tobacco Use   Smoking status: Never   Smokeless tobacco: Never  Substance and Sexual Activity   Alcohol use: Yes    Comment: Occasional   Drug use: Never   Sexual activity: Not on file  Other Topics Concern   Not on file  Social History Narrative   Not on file   Social Determinants of Health    Financial Resource Strain: Not on file  Food Insecurity: Not on file  Transportation Needs: Not on file  Physical Activity: Not on file  Stress: Not on file  Social Connections: Not on file  Intimate Partner Violence: Not on file   Family History  Problem Relation Age of Onset   Stroke Father      OBJECTIVE:  Vitals:   10/24/20 1558 10/24/20 1619  BP: (!) 144/89 133/88  Pulse: 84 72  Temp: (!) 97 F (36.1 C)   TempSrc: Temporal   SpO2: 97%   Weight: 174 lb 9.6 oz (79.2 kg)   Height: _0  (1.727 m)     Physical Exam Vitals reviewed.  Constitutional:      Appearance: Normal appearance.  HENT:     Head: Normocephalic.     Right Ear: Tympanic membrane and external ear normal.     Left Ear: Tympanic membrane and external ear normal.     Nose: Nose normal.  Eyes:     Extraocular Movements: Extraocular movements intact.     Pupils: Pupils are equal, round, and reactive to light.  Cardiovascular:     Rate and Rhythm: Normal rate and regular rhythm.  Pulmonary:     Effort: Pulmonary effort is normal.     Breath sounds: Normal breath sounds.  Abdominal:     General: Bowel sounds are normal.     Palpations: Abdomen is soft.  Musculoskeletal:  General: Normal range of motion.     Cervical back: Normal range of motion.  Skin:    General: Skin is warm and dry.  Neurological:     Mental Status: He is alert and oriented to person, place, and time.  Psychiatric:        Mood and Affect: Mood normal.        Behavior: Behavior normal.        Thought Content: Thought content normal.        Judgment: Judgment normal.    Review of Systems  All other systems reviewed and are negative.  Last 3 Office BP readings: BP Readings from Last 3 Encounters:  10/24/20 133/88  10/15/20 (!) 149/77  10/10/20 (!) 160/93    BMET    Component Value Date/Time   NA 138 10/10/2020 1416   K 4.3 10/10/2020 1416   CL 102 10/10/2020 1416   CO2 25 10/10/2020 1416   GLUCOSE 98  10/10/2020 1416   GLUCOSE 107 (H) 09/03/2020 0231   BUN 17 10/10/2020 1416   CREATININE 0.86 10/10/2020 1416   CALCIUM 9.6 10/10/2020 1416   GFRNONAA >60 09/03/2020 0231    Renal function: CrCl cannot be calculated (Patient's most recent lab result is older than the maximum 21 days allowed.).  Clinical ASCVD: No  The ASCVD Risk score (Arnett DK, et al., 2019) failed to calculate for the following reasons:   The valid total cholesterol range is 130 to 320 mg/dL  ASCVD risk factors include- Nathaniel Hernandez  Nathaniel Hernandez was seen today for blood pressure check.  Diagnoses and all orders for this visit:  Essential hypertension    CMP14+EGFR; Future -Counseled on lifestyle modifications for blood pressure control including reduced dietary sodium, increased exercise, weight reduction and adequate sleep. Also, educated patient about the risk for cardiovascular events, stroke and heart attack. Also counseled patient about the importance of medication adherence. If you participate in smoking, it is important to stop using tobacco as this will increase the risks associated with uncontrolled blood pressure.   -Hypertension longstanding diagnosed currently hydrochlorothiazide 25 mg daily on current medications. Patient is adherent with current medications.   Goal BP:  For patients younger than 60: Goal BP < 130/80. For patients 60 and older: Goal BP < 140/90. For patients with diabetes: Goal BP < 130/80. Your most recent BP: 133/88  Minimize salt intake. Minimize alcohol intake  TIA (transient ischemic attack) -     atorvastatin (LIPITOR) 80 MG tablet; Take 1 tablet (80 mg total) by mouth daily. -     Lipid panel; Future    Meds ordered this encounter  Medications   atorvastatin (LIPITOR) 80 MG tablet    Sig: Take 1 tablet (80 mg total) by mouth daily.    Dispense:  90 tablet    Refill:  1    On going refills per PCP   -      This note has been created with Designer, industrial/product. Any transcriptional errors are unintentional.   Kerin Perna, NP 11/03/2020, 11:45 PM

## 2021-01-23 ENCOUNTER — Other Ambulatory Visit: Payer: Self-pay

## 2021-01-23 ENCOUNTER — Ambulatory Visit (INDEPENDENT_AMBULATORY_CARE_PROVIDER_SITE_OTHER): Payer: Self-pay | Admitting: Primary Care

## 2021-01-23 ENCOUNTER — Encounter (INDEPENDENT_AMBULATORY_CARE_PROVIDER_SITE_OTHER): Payer: Self-pay | Admitting: Primary Care

## 2021-01-23 VITALS — BP 134/76 | HR 85 | Temp 97.9°F | Ht 68.0 in | Wt 171.2 lb

## 2021-01-23 DIAGNOSIS — I10 Essential (primary) hypertension: Secondary | ICD-10-CM

## 2021-01-23 DIAGNOSIS — Z760 Encounter for issue of repeat prescription: Secondary | ICD-10-CM

## 2021-01-23 DIAGNOSIS — G459 Transient cerebral ischemic attack, unspecified: Secondary | ICD-10-CM

## 2021-01-23 MED ORDER — HYDROCHLOROTHIAZIDE 25 MG PO TABS: 25.0000 mg | ORAL_TABLET | Freq: Every day | ORAL | 1 refills | Status: DC

## 2021-01-23 MED ORDER — AMLODIPINE BESYLATE 10 MG PO TABS: 10.0000 mg | ORAL_TABLET | Freq: Every day | ORAL | 1 refills | Status: DC

## 2021-01-23 NOTE — Progress Notes (Signed)
[name]   [name] is a 60 y.o. Hispanic male( son Nathaniel Hernandez was given permission to be at visit and interpret for him)  presents for hypertension evaluation, Denies shortness of breath, headaches, chest pain or lower extremity edema, sudden onset, vision changes, unilateral weakness, dizziness, paresthesias   Patient reports adherence with medications.  Dietary habits include: watching sodium intake  Exercise habits include:running /or walk Family / Social history: None   Past Medical History:  Diagnosis Date   Hypertension    Past Surgical History:  Procedure Laterality Date   BUBBLE STUDY  09/03/2020   Procedure: BUBBLE STUDY;  Surgeon: Lelon Perla, MD;  Location: [facility];  Service: Cardiovascular;;   TEE WITHOUT CARDIOVERSION N/A 09/03/2020   Procedure: TRANSESOPHAGEAL ECHOCARDIOGRAM (TEE);  Surgeon: Lelon Perla, MD;  Location: [facility];  Service: Cardiovascular;  Laterality: N/A;   No Known Allergies Current Outpatient Medications on File Prior to Visit  Medication Sig Dispense Refill   Nathaniel Hernandez (NORVASC) 10 MG tablet Take 1 tablet (10 mg total) by mouth daily. 90 tablet 1   atorvastatin (LIPITOR) 80 MG tablet Take 1 tablet (80 mg total) by mouth daily. 90 tablet 1   hydrochlorothiazide (HYDRODIURIL) 25 MG tablet Take 1 tablet (25 mg total) by mouth daily. 90 tablet 1   No current facility-administered medications on file prior to visit.   Social History   Socioeconomic History   Marital status: Married    Spouse name: Not on file   Number of children: Not on file   Years of education: Not on file   Highest education level: Not on file  Occupational History   Not on file  Tobacco Use   Smoking status: Never   Smokeless tobacco: Never  Substance and Sexual Activity   Alcohol use: Yes    Comment: Occasional   Drug use: Never   Sexual activity: Not on file  Other Topics Concern   Not on file  Social History  Narrative   Not on file   Social Determinants of Health   Financial Resource Strain: Not on file  Food Insecurity: Not on file  Transportation Needs: Not on file  Physical Activity: Not on file  Stress: Not on file  Social Connections: Not on file  Intimate Partner Violence: Not on file   Family History  Problem Relation Age of Onset   Stroke Father      OBJECTIVE:  Vitals:   01/23/21 0956  BP: 134/76  Pulse: 85  Temp: 97.9 F (36.6 C)  TempSrc: Temporal  SpO2: 98%  Weight: 171 lb 3.2 oz (77.7 kg)  Height: 5' 8" (1.727 m)    Physical exam: General: Vital signs reviewed.  Patient is well-developed and well-nourished,male  in no acute distress and cooperative with exam. Head: Normocephalic and atraumatic. Eyes: EOMI, conjunctivae normal, no scleral icterus. Neck: Supple, trachea midline, normal ROM, no JVD, masses, thyromegaly, or carotid bruit present. Cardiovascular: RRR, S1 normal, S2 normal, no murmurs, gallops, or rubs. Pulmonary/Chest: Clear to auscultation bilaterally, no wheezes, rales, or rhonchi. Abdominal: Soft, non-tender, non-distended, BS +, no masses, organomegaly, or guarding present. Musculoskeletal: No joint deformities, erythema, or stiffness, ROM full and nontender. Extremities: No lower extremity edema bilaterally,  pulses symmetric and intact bilaterally. No cyanosis or clubbing. Neurological: A&O x3, Strength is normal Skin: Warm, dry and intact. No rashes or erythema. Psychiatric: Normal mood and affect. speech and behavior is normal. Cognition and memory are normal.  ROS Pertinent positive and negative noted in HPI Last 3 Office BP readings: BP Readings from Last 3 Encounters:  01/23/21 134/76  10/24/20 133/88  10/15/20 (!) 149/77    BMET    Component Value Date/Time   NA 138 10/10/2020 1416   K 4.3 10/10/2020 1416   CL 102 10/10/2020 1416   CO2 25 10/10/2020 1416   GLUCOSE 98 10/10/2020 1416   GLUCOSE 107 (H) 09/03/2020 0231    BUN 17 10/10/2020 1416   CREATININE 0.86 10/10/2020 1416   CALCIUM 9.6 10/10/2020 1416   GFRNONAA >60 09/03/2020 0231    Renal function: CrCl cannot be calculated (Patient's most recent lab result is older than the maximum 21 days allowed.).  Clinical ASCVD: No  The ASCVD Risk score (Arnett DK, et al., 2019) failed to calculate for the following reasons:   The valid total cholesterol range is 130 to 320 mg/dL  ASCVD risk factors include- [facility] Hernandez   ASSESSMENT & PLAN:  Nathaniel Hernandez was seen today for hypertension.  Diagnoses and all orders for this visit:  Essential hypertension -Counseled on lifestyle modifications for blood pressure control including reduced dietary sodium, increased exercise, weight reduction and adequate sleep. Also, educated patient about the risk for cardiovascular events, stroke and heart attack. Also counseled patient about the importance of medication adherence. If you participate in smoking, it is important to stop using tobacco as this will increase the risks associated with uncontrolled blood pressure.   -Hypertension longstanding diagnosed currently Nathaniel Hernandez 73m and Nathaniel Hernandez 260mdaily  on current medications. Patient is adherent with current medications.   Goal BP:  For patients younger than 60: Goal BP < 130/80. For patients 60 and older: Goal BP < 140/90. For patients with diabetes: Goal BP < 130/80. Your most recent BP: 134/76  Nathaniel Hernandez salt intake. Nathaniel Hernandez alcohol intake -     Nathaniel Hernandez (NORVASC) 10 MG tablet; Take 1 tablet (10 mg total) by mouth daily. -     CMP14+EGFR  TIA (transient ischemic attack) Refill after results reviewed -     Lipid panel     Encounter for medication refill Nathaniel Hernandez 104mnd Nathaniel Hernandez 34m45mhis note has been created with DragSur[employer]or, quantityy transcriptional errors are unintentional.   MichKerin Perna 01/23/2021, 9:59 AM

## 2021-01-24 LAB — LIPID PANEL
Chol/HDL Ratio: 2.6 ratio (ref 0.0–5.0)
Cholesterol, Total: 146 mg/dL (ref 100–199)
HDL: 57 mg/dL (ref >39)
LDL Chol Calc (NIH): 76 mg/dL (ref 0–99)
Triglycerides: 63 mg/dL (ref 0–149)
VLDL Cholesterol Cal: 13 mg/dL (ref 5–40)

## 2021-01-24 LAB — CMP14+EGFR
ALT: 48 IU/L — ABNORMAL HIGH (ref 0–44)
AST: 39 IU/L (ref 0–40)
Albumin/Globulin Ratio: 2.2 (ref 1.2–2.2)
Albumin: 4.9 g/dL (ref 3.8–4.9)
Alkaline Phosphatase: 112 IU/L (ref 44–121)
BUN/Creatinine Ratio: 20 (ref 9–20)
BUN: 17 mg/dL (ref 6–24)
Bilirubin Total: 1 mg/dL (ref 0.0–1.2)
CO2: 27 mmol/L (ref 20–29)
Calcium: 9.7 mg/dL (ref 8.7–10.2)
Chloride: 95 mmol/L — ABNORMAL LOW (ref 96–106)
Creatinine, Ser: 0.86 mg/dL (ref 0.76–1.27)
Globulin, Total: 2.2 g/dL (ref 1.5–4.5)
Glucose: 99 mg/dL (ref 70–99)
Potassium: 3.7 mmol/L (ref 3.5–5.2)
Sodium: 137 mmol/L (ref 134–144)
Total Protein: 7.1 g/dL (ref 6.0–8.5)
eGFR: 100 mL/min/{1.73_m2} (ref >59)

## 2021-01-28 ENCOUNTER — Telehealth (INDEPENDENT_AMBULATORY_CARE_PROVIDER_SITE_OTHER): Payer: Self-pay

## 2021-01-28 NOTE — Telephone Encounter (Signed)
-----   Message from Grayce Sessions, NP sent at 01/28/2021 10:37 AM EST ----- Labs are normal. Will continue to monitor. Make sure you are drinking at least 48 oz of water per day. Work on eating a low fat, heart healthy diet and participate in regular aerobic exercise program to control as well. Exercise at least  30 minutes per day-5 days per week. Avoid red meat. No fried foods. No junk foods, sodas, sugary foods or drinks, unhealthy snacking, alcohol or smoking.

## 2021-01-28 NOTE — Telephone Encounter (Signed)
Call placed to patient with assistance of pacific interpreter Augusto([id number]) per DPR left message informing patient of normal labs. Drink 48 oz water daily, 30 minutes exercise for minimum of 5 days a week. Return call to [facility] at [phone] with any questions or concerns. Nat Christen, CMA

## 2021-04-21 ENCOUNTER — Ambulatory Visit: Payer: 59 | Admitting: Adult Health

## 2021-04-21 ENCOUNTER — Encounter: Payer: Self-pay | Admitting: Adult Health

## 2021-04-21 VITALS — BP 142/82 | HR 89 | Wt 174.0 lb

## 2021-04-21 DIAGNOSIS — G459 Transient cerebral ischemic attack, unspecified: Secondary | ICD-10-CM

## 2021-04-21 NOTE — Patient Instructions (Addendum)
Continue aspirin 81 mg daily  and atorvastatin for secondary stroke prevention ? ?Will place order for 30 day cardiac monitor - they will call you to get this set up  ? ?Continue to follow up with PCP regarding cholesterol and blood pressure management  ?Maintain strict control of hypertension with blood pressure goal below 130/90 and cholesterol with LDL cholesterol (bad cholesterol) goal below 70 mg/dL.  ? ?Signs of a Stroke? Follow the BEFAST method:  ?Balance Watch for a sudden loss of balance, trouble with coordination or vertigo ?Eyes Is there a sudden loss of vision in one or both eyes? Or double vision?  ADLs ?Face: Ask the person to smile. Does one side of the face droop or is it numb?  ?Arms: Ask the person to raise both arms. Does one arm drift downward? Is there weakness or numbness of a leg? ?Speech: Ask the person to repeat a simple phrase. Does the speech sound slurred/strange? Is the person confused ? ?Time: If you observe any of these signs, call 911. ? ? ? ? ? ? ? ? ?Thank you for coming to see [place] at [facility]. I hope we have been able to provide you high quality care today. ? ?You may receive a patient satisfaction survey over the next few weeks. We would appreciate your feedback and comments so that we may continue to improve ourselves and the health of our patients. ? ?

## 2021-04-21 NOTE — Progress Notes (Signed)
?[facility] ?[address] ?[place]. [place] [address] ?[phone] ? ?     STROKE FOLLOW UP NOTE ? ?Nathaniel Hernandez ?Date of Birth:  1960-03-14 ?Medical Record Number:  [id number]  ? ? ?Primary neurologist: Dr. Leonie Man ?Reason for Referral: stroke follow up ? ? ? ?SUBJECTIVE: ? ? ?CHIEF COMPLAINT:  ?Chief Complaint  ?Patient presents with  ? Follow-up  ?  Rm 2 with son Royetta Car  ?Pt is well and stable, no new concerns   ? ? ?HPI:  ? ?Update 04/21/2021 JM: Returns for 66-month stroke follow-up accompanied by his son who assists with interpretation with permission of patient.  Overall stable without new or reoccurring stroke/TIA symptoms. Continues to maintain ADLs and IADLs independently, continues to work without difficultly. He is now insured. Compliant on aspirin and atorvastatin, denies side effects.  Blood pressure today 142/82. Routinely monitors at home and typically stable.  No new concerns today. ? ? ? ?Hernandez provided for reference purposes only ?Initial visit 10/15/2020 JM: Nathaniel Hernandez is being seen for hospital follow-up accompanied by his son who assists with interpretation (patient speaks limited [place]).  Overall doing well.  Denies new or reoccurring stroke/TIA symptoms. He has since returned back to prior level of functioning. 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  Completed 3 weeks DAPT -remains on aspirin without side effects but stopped atorvastatin for unknown reason - no difficulty tolerating. Blood pressure today 149/77. Monitors at home and typically 130s-140s. He is still uninsured - he plans on applying for state insurance next month as he is self employed.  He has not yet completed cardiac monitor due to lack of insurance.  No further concerns at this time. ? ?Stroke admission 09/01/2020 ?Nathaniel Hernandez is a 61 y.o. male with no significant past medical Hernandez who presented to [facility] ED on 09/01/2020 with right-sided weakness and difficulty  speaking that was abrupt in onset and lasted for approximately 10 minutes.  He was up with his family around 2 AM when he had abrupt onset right arm and leg weakness with speech difficulty.  EMS was called, and on arrival they found him to be completely flaccid on the right however he began improving in transit and by the time of arrival to the emergency department he was back to normal.  Evaluated by Dr. 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  MRI and CTA head/neck unremarkable.  EF 55 to 60%.  TEE no evidence of thrombus or vegetation or PFO.  Recommended 30-day cardiac event monitor to rule out A. Fib (currently uninsured) -requested set up by PCP.  Direct LDL 149.9, triglycerides 790.  A1c 5.4.  UDS negative.  Recommended DAPT for 3 weeks and aspirin alone as well as atorvastatin 80 mg daily.  No prior stroke Hernandez.  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  Therapy eval's cleared without therapy needs. ? ? ? ? ?PERTINENT IMAGING ? ?MR BRAIN 09/01/2020 ?IMPRESSION: ?Normal brain MRI ? ?CTA HEAD/NECK 09/01/2020 ?IMPRESSION: ?1. Normal head CT. ?2. ASPECTS is 10. ?3. No emergent large vessel occlusion. Unremarkable CTA of the head ?and neck. ? ?2D ECHO 09/01/2020 ?IMPRESSIONS  ? 1. Cannot exclude small mobile lesion on ventricular aspect of aortic  ?valve vs. artifact from adjacent calcium. See recommendations. The aortic  ?valve is grossly normal. There is mild calcification of the aortic valve.  ?Aortic valve regurgitation is not  ?visualized. Mild aortic valve sclerosis is  present, with no evidence of  ?aortic valve stenosis.  ? 2. Left ventricular ejection fraction, by estimation, is 55 to 60%. The  ?left ventricle has normal function. The left ventricle has no regional  ?wall motion abnormalities. There is mild left ventricular hypertrophy.  ?Left ventricular diastolic parameters  ?were normal.  ? 3. Right ventricular systolic  function is normal. The right ventricular  ?size is normal. Tricuspid regurgitation signal is inadequate for assessing  ?PA pressure.  ? 4. The mitral valve is normal in structure. No evidence of mitral valve  ?regurgitation. No evidence of mitral stenosis.  ? 5. The inferior vena cava is normal in size with greater than 50%  ?respiratory variability, suggesting right atrial pressure of 3 mmHg.  ? ?ECHO TEE 09/03/2020 ?IMPRESSIONS  ? 1. Left ventricular ejection fraction, by estimation, is 55 to 60%. The  ?left ventricle has normal function. The left ventricle has no regional  ?wall motion abnormalities.  ? 2. Right ventricular systolic function is normal. The right ventricular  ?size is normal.  ? 3. No left atrial/left atrial appendage thrombus was detected.  ? 4. The mitral valve is normal in structure. Trivial mitral valve  ?regurgitation.  ? 5. The aortic valve is tricuspid. Aortic valve regurgitation is not  ?visualized. Mild aortic valve sclerosis is present, with no evidence of  ?aortic valve stenosis.  ? 6. Agitated saline contrast bubble study was negative, with no evidence  ?of any interatrial shunt.  ? ? ? ? ?ROS:   ?14 system review of systems performed and negative with exception of those listed in HPI ? ?PMH:  ?Past Medical Hernandez:  ?Diagnosis Date  ? Hypertension   ? ? ?PSH:  ?Past Surgical Hernandez:  ?Procedure Laterality Date  ? BUBBLE STUDY  09/03/2020  ? Procedure: BUBBLE STUDY;  Surgeon: Lelon Perla, MD;  Location: [facility];  Service: Cardiovascular;;  ? TEE WITHOUT CARDIOVERSION N/A 09/03/2020  ? Procedure: TRANSESOPHAGEAL ECHOCARDIOGRAM (TEE);  Surgeon: Lelon Perla, MD;  Location: [facility] ENDOSCOPY;  Service: Cardiovascular;  Laterality: N/A;  ? ? ?Social Hernandez:  ?Social Hernandez  ? ?Socioeconomic Hernandez  ? Marital status: Married  ?  Spouse name: Not on file  ? Number of children: Not on file  ? Years of education: Not on file  ? Highest education level: Not on file  ?Occupational  Hernandez  ? Not on file  ?Tobacco Use  ? Smoking status: Never  ? Smokeless tobacco: Never  ?Substance and Sexual Activity  ? Alcohol use: Yes  ?  Comment: Occasional  ? Drug use: Never  ? Sexual activity: Not on file  ?Other Topics Concern  ? Not on file  ?Social Hernandez Narrative  ? Not on file  ? ?Social Determinants of Health  ? ?Financial Resource Strain: Not on file  ?Food Insecurity: Not on file  ?Transportation Needs: Not on file  ?Physical Activity: Not on file  ?Stress: Not on file  ?Social Connections: Not on file  ?Intimate Partner Violence: Not on file  ? ? ?Family Hernandez:  ?Family Hernandez  ?Problem Relation Age of Onset  ? Stroke Father   ? ? ?Medications:   ?Current Outpatient Medications on File Prior to Visit  ?Medication Sig Dispense Refill  ? amLODipine (NORVASC) 10 MG tablet Take 1 tablet (10 mg total) by mouth daily. 90 tablet 1  ? atorvastatin (LIPITOR) 80 MG tablet Take 1 tablet (80 mg total) by mouth daily. 90 tablet 1  ? hydrochlorothiazide (HYDRODIURIL) 25  MG tablet Take 1 tablet (25 mg total) by mouth daily. 90 tablet 1  ? ?No current facility-administered medications on file prior to visit.  ? ? ?Allergies:  No Known Allergies ? ? ? ?OBJECTIVE: ? ?Physical Exam ? ?Vitals:  ? 04/21/21 1019  ?BP: (!) 142/82  ?Pulse: 89  ?Weight: 174 lb (78.9 kg)  ? ? ?Body mass index is 26.46 kg/m?Marland Kitchen ?No results found. ? ?General: well developed, well nourished, pleasant middle-aged male, seated, in no evident distress ?Head: head normocephalic and atraumatic.   ?Neck: supple with no carotid or supraclavicular bruits ?Cardiovascular: regular rate and rhythm, no murmurs ?Musculoskeletal: no deformity ?Skin:  no rash/petichiae ?Vascular:  Normal pulses all extremities ?  ?Neurologic Exam ?Mental Status: Awake and fully alert.  Limited English although denies speech or language difficulties.  Oriented to place and time. Recent and remote memory intact. Attention span, concentration and fund of knowledge  appropriate. Mood and affect appropriate.  ?Cranial Nerves: Pupils equal, briskly reactive to light. Extraocular movements full without nystagmus. Visual fields full to confrontation. Hearing intact. Facial sensation intac

## 2021-04-22 ENCOUNTER — Encounter: Payer: Self-pay | Admitting: *Deleted

## 2021-04-22 NOTE — Progress Notes (Signed)
Patient ID: Nathaniel Hernandez, male   DOB: 01/28/1960, 61 y.o.   MRN: [id number] ?Patient enrolled for [employer] to ship a 30 day cardiac event monitor to his address on file. ? ?Letter with instructions in Spanish mailed to patient. ?

## 2021-04-30 ENCOUNTER — Ambulatory Visit (INDEPENDENT_AMBULATORY_CARE_PROVIDER_SITE_OTHER): Payer: Self-pay

## 2021-04-30 DIAGNOSIS — I4891 Unspecified atrial fibrillation: Secondary | ICD-10-CM

## 2021-04-30 DIAGNOSIS — G459 Transient cerebral ischemic attack, unspecified: Secondary | ICD-10-CM

## 2021-06-02 ENCOUNTER — Other Ambulatory Visit: Payer: Self-pay | Admitting: Adult Health

## 2021-06-02 DIAGNOSIS — I4891 Unspecified atrial fibrillation: Secondary | ICD-10-CM

## 2021-06-02 DIAGNOSIS — G459 Transient cerebral ischemic attack, unspecified: Secondary | ICD-10-CM

## 2021-07-24 ENCOUNTER — Encounter (INDEPENDENT_AMBULATORY_CARE_PROVIDER_SITE_OTHER): Payer: Self-pay | Admitting: Primary Care

## 2021-07-24 ENCOUNTER — Ambulatory Visit (INDEPENDENT_AMBULATORY_CARE_PROVIDER_SITE_OTHER): Payer: 59 | Admitting: Primary Care

## 2021-07-24 VITALS — BP 141/75 | HR 72 | Temp 98.0°F | Ht 68.0 in | Wt 173.6 lb

## 2021-07-24 DIAGNOSIS — Z1211 Encounter for screening for malignant neoplasm of colon: Secondary | ICD-10-CM

## 2021-07-24 DIAGNOSIS — Z760 Encounter for issue of repeat prescription: Secondary | ICD-10-CM

## 2021-07-24 DIAGNOSIS — I10 Essential (primary) hypertension: Secondary | ICD-10-CM | POA: Diagnosis not present

## 2021-07-24 MED ORDER — HYDROCHLOROTHIAZIDE 25 MG PO TABS: 25.0000 mg | ORAL_TABLET | Freq: Every day | ORAL | 1 refills | Status: DC

## 2021-07-24 MED ORDER — AMLODIPINE BESYLATE 10 MG PO TABS: 10.0000 mg | ORAL_TABLET | Freq: Every day | ORAL | 1 refills | Status: DC

## 2021-07-24 NOTE — Progress Notes (Signed)
[facility]   Nathaniel Hernandez is a 61 y.o. male presents for hypertension evaluation, Denies shortness of breath, headaches, chest pain or lower extremity edema, sudden onset, vision changes, unilateral weakness, dizziness, paresthesias   Patient reports adherence with medications.  Dietary habits include: monitor sodium Exercise habits include:no  Family / Social history: Father stroke    Past Medical History:  Diagnosis Date   Hypertension    Past Surgical History:  Procedure Laterality Date   BUBBLE STUDY  09/03/2020   Procedure: BUBBLE STUDY;  Surgeon: Lewayne Bunting, MD;  Location: [facility] ENDOSCOPY;  Service: Cardiovascular;;   TEE WITHOUT CARDIOVERSION N/A 09/03/2020   Procedure: TRANSESOPHAGEAL ECHOCARDIOGRAM (TEE);  Surgeon: Lewayne Bunting, MD;  Location: [facility] ENDOSCOPY;  Service: Cardiovascular;  Laterality: N/A;   No Known Allergies Current Outpatient Medications on File Prior to Visit  Medication Sig Dispense Refill   amLODipine (NORVASC) 10 MG tablet Take 1 tablet (10 mg total) by mouth daily. 90 tablet 1   aspirin EC 81 MG tablet Take 81 mg by mouth daily. Swallow whole.     atorvastatin (LIPITOR) 80 MG tablet Take 1 tablet (80 mg total) by mouth daily. 90 tablet 1   hydrochlorothiazide (HYDRODIURIL) 25 MG tablet Take 1 tablet (25 mg total) by mouth daily. 90 tablet 1   No current facility-administered medications on file prior to visit.   Social History   Socioeconomic History   Marital status: Married    Spouse name: Not on file   Number of children: Not on file   Years of education: Not on file   Highest education level: Not on file  Occupational History   Not on file  Tobacco Use   Smoking status: Never   Smokeless tobacco: Never  Substance and Sexual Activity   Alcohol use: Yes    Comment: Occasional   Drug use: Never   Sexual activity: Not on file  Other Topics Concern   Not on file  Social History Narrative   Not on file    Social Determinants of Health   Financial Resource Strain: Not on file  Food Insecurity: Not on file  Transportation Needs: Not on file  Physical Activity: Not on file  Stress: Not on file  Social Connections: Not on file  Intimate Partner Violence: Not on file   Family History  Problem Relation Age of Onset   Stroke Father      OBJECTIVE:  Vitals:   07/24/21 1002 07/24/21 1015  BP: (!) 150/74 (!) 141/75  Pulse: 73 72  Temp: 98 F (36.7 C)   TempSrc: Oral   SpO2: 96%   Weight: 173 lb 9.6 oz (78.7 kg)   Height: 5\' 8"  (1.727 m)     Physical Exam Vitals reviewed.  Constitutional:      Appearance: Normal appearance.  HENT:     Head: Normocephalic.     Right Ear: Tympanic membrane and external ear normal.     Left Ear: Tympanic membrane and external ear normal.     Nose: Nose normal.  Eyes:     Extraocular Movements: Extraocular movements intact.     Pupils: Pupils are equal, round, and reactive to light.  Cardiovascular:     Rate and Rhythm: Normal rate and regular rhythm.  Pulmonary:     Effort: Pulmonary effort is normal.     Breath sounds: Normal breath sounds.  Abdominal:     General: Bowel sounds are normal.     Palpations: Abdomen is soft.  Musculoskeletal:        General: Normal range of motion.     Cervical back: Normal range of motion.  Skin:    General: Skin is warm and dry.  Neurological:     Mental Status: He is alert and oriented to person, place, and time.  Psychiatric:        Mood and Affect: Mood normal.        Behavior: Behavior normal.    ROS Comprehensive ROS Pertinent positive and negative noted in HPI   Last 3 Office BP readings: BP Readings from Last 3 Encounters:  07/24/21 (!) 141/75  04/21/21 (!) 142/82  01/23/21 134/76    BMET    Component Value Date/Time   NA 137 01/23/2021 1022   K 3.7 01/23/2021 1022   CL 95 (L) 01/23/2021 1022   CO2 27 01/23/2021 1022   GLUCOSE 99 01/23/2021 1022   GLUCOSE 107 (H) 09/03/2020  0231   BUN 17 01/23/2021 1022   CREATININE 0.86 01/23/2021 1022   CALCIUM 9.7 01/23/2021 1022   GFRNONAA >60 09/03/2020 0231    Renal function: CrCl cannot be calculated (Patient's most recent lab result is older than the maximum 21 days allowed.).  Clinical ASCVD: Yes  The 10-year ASCVD risk score (Arnett DK, et al., 2019) is: 8.1%   Values used to calculate the score:     Age: 32 years     Sex: Male     Is Non-Hispanic African American: No     Diabetic: No     Tobacco smoker: No     Systolic Blood Pressure: 141 mmHg     Is BP treated: Yes     HDL Cholesterol: 57 mg/dL     Total Cholesterol: 146 mg/dL  ASCVD risk factors include- [place]   ASSESSMENT & PLAN: [name] was seen today for hypertension.  Diagnoses and all orders for this visit:  Essential hypertension -Counseled on lifestyle modifications for blood pressure control including reduced dietary sodium, increased exercise, weight reduction and adequate sleep. Also, educated patient about the risk for cardiovascular events, stroke and heart attack. Also counseled patient about the importance of medication adherence. If you participate in smoking, it is important to stop using tobacco as this will increase the risks associated with uncontrolled blood pressure.  Goal BP:  For patients younger than 60: Goal BP < 130/80. For patients 60 and older: Goal BP < 140/90. For patients with diabetes: Goal BP < 130/80. Your most recent BP: 145/75  Minimize salt intake. Minimize alcohol intake  Colon cancer screening -     Ambulatory referral to Gastroenterology  Encounter for medication refill -     amLODipine (NORVASC) 10 MG tablet; Take 1 tablet (10 mg total) by mouth daily. -     hydrochlorothiazide (HYDRODIURIL) 25 MG tablet; Take 1 tablet (25 mg total) by mouth daily.     This note has been created with Education officer, environmental. Any transcriptional errors are unintentional.    Grayce Sessions, NP 07/24/2021, 10:24 AM

## 2021-10-24 ENCOUNTER — Encounter (INDEPENDENT_AMBULATORY_CARE_PROVIDER_SITE_OTHER): Payer: Self-pay | Admitting: Primary Care

## 2021-10-24 ENCOUNTER — Ambulatory Visit (INDEPENDENT_AMBULATORY_CARE_PROVIDER_SITE_OTHER): Payer: 59 | Admitting: Primary Care

## 2021-10-24 VITALS — BP 154/81 | HR 73 | Resp 16 | Ht 68.0 in | Wt 174.8 lb

## 2021-10-24 DIAGNOSIS — I10 Essential (primary) hypertension: Secondary | ICD-10-CM | POA: Diagnosis not present

## 2021-10-24 DIAGNOSIS — Z13220 Encounter for screening for lipoid disorders: Secondary | ICD-10-CM

## 2021-10-24 DIAGNOSIS — Z131 Encounter for screening for diabetes mellitus: Secondary | ICD-10-CM

## 2021-10-24 DIAGNOSIS — Z0000 Encounter for general adult medical examination without abnormal findings: Secondary | ICD-10-CM

## 2021-10-24 DIAGNOSIS — Z1211 Encounter for screening for malignant neoplasm of colon: Secondary | ICD-10-CM

## 2021-10-24 NOTE — Progress Notes (Signed)
Nathaniel Hernandez, is a 61 y.o. male  [id number]  [id number]  DOB - 12/01/1960  Chief Complaint  Patient presents with   Hypertension       Subjective:   Nathaniel Hernandez is a 61 y.o. male here today for a follow up visit. Patient has No headache, No chest pain, No abdominal pain - No Nausea, No new weakness tingling or numbness, No Cough - shortness of breath  No problems updated.  No Known Allergies  Past Medical History:  Diagnosis Date   Hypertension     Current Outpatient Medications on File Prior to Visit  Medication Sig Dispense Refill   amLODipine (NORVASC) 10 MG tablet Take 1 tablet (10 mg total) by mouth daily. 90 tablet 1   aspirin EC 81 MG tablet Take 81 mg by mouth daily. Swallow whole.     atorvastatin (LIPITOR) 80 MG tablet Take 1 tablet (80 mg total) by mouth daily. 90 tablet 1   hydrochlorothiazide (HYDRODIURIL) 25 MG tablet Take 1 tablet (25 mg total) by mouth daily. 90 tablet 1   No current facility-administered medications on file prior to visit.    Objective:   Vitals:   10/24/21 0905  BP: (!) 154/81  Pulse: 73  Resp: 16  SpO2: 96%  Weight: 174 lb 12.8 oz (79.3 kg)  Height: _0  (1.727 m)    Exam General appearance : Awake, alert, not in any distress. Speech Clear. Not toxic looking HEENT: Atraumatic and Normocephalic, pupils equally reactive to light and accomodation Neck: Supple, no JVD. No cervical lymphadenopathy.  Chest: Good air entry bilaterally, no added sounds  CVS: S1 S2 regular, no murmurs.  Abdomen: Bowel sounds present, Non tender and not distended with no gaurding, rigidity or rebound. Extremities: B/L Lower Ext shows no edema, both legs are warm to touch Neurology: Awake alert, and oriented X 3, CN II-XII intact, Non focal Skin: No Rash  Data Review Lab Results  Component Value Date   HGBA1C 5.4 09/01/2020    Assessment & Plan   1. Essential hypertension Systolic [id number]     Diastolic 65-46 BP goal - < 130/80 Explained that having normal blood pressure is the goal and medications are helping to get to goal and maintain normal blood pressure. DIET: Limit salt intake, read nutrition labels to check salt content, limit fried and high fatty foods  Avoid using multisymptom OTC cold preparations that generally contain sudafed which can rise BP. Consult with pharmacist on best cold relief products to use for persons with HTN EXERCISE Discussed incorporating exercise such as walking - 30 minutes most days of the week and can do in 10 minute intervals    - CBC - CMP14+EGFR  2. Healthcare maintenance - HCV Ab w Reflex to Quant PCR - CBC  3. Diabetes mellitus screening - Hemoglobin A1c  4. Colon cancer screening Normal colon cancer screening.  CDC recommends colorectal screening from ages 67-75.  This screening is used for a disease when no symptoms are present . Diagnostic test is used for symptoms examples blood in stool, colorectal polyps or coloector cancer, family history or inflammatory bowel disease - chron's or ulcerative colitis .(USPSTF)   - Ambulatory referral to Gastroenterology  5. Lipid screening Atorvastatin 80 mg   Healthy lifestyle diet of fruits vegetables fish nuts whole grains and low saturated fat . Foods high in cholesterol or liver, fatty meats,cheese, butter avocados, nuts and seeds, chocolate and fried foods.  - Lipid panel  Patient have been counseled extensively about nutrition and exercise. Other issues discussed during this visit include: low cholesterol diet, weight control and daily exercise, foot care, annual eye examinations at Ophthalmology, importance of adherence with medications and regular follow-up. We also discussed long term complications of uncontrolled diabetes and hypertension.   Return in about 6 months (around 04/24/2022) for Bp.  The patient was given clear instructions to go to ER or return to medical center if  symptoms don't improve, worsen or new problems develop. The patient verbalized understanding. The patient was told to call to get lab results if they haven't heard anything in the next week.   This note has been created with Surveyor, quantity. Any transcriptional errors are unintentional.   Kerin Perna, NP 10/24/2021, 9:35 AM

## 2021-10-28 LAB — LIPID PANEL
Chol/HDL Ratio: 5.6 ratio — ABNORMAL HIGH (ref 0.0–5.0)
Cholesterol, Total: 317 mg/dL — ABNORMAL HIGH (ref 100–199)
HDL: 57 mg/dL (ref >39)
LDL Chol Calc (NIH): 215 mg/dL — ABNORMAL HIGH (ref 0–99)
Triglycerides: 231 mg/dL — ABNORMAL HIGH (ref 0–149)
VLDL Cholesterol Cal: 45 mg/dL — ABNORMAL HIGH (ref 5–40)

## 2021-10-28 LAB — CMP14+EGFR
ALT: 36 IU/L (ref 0–44)
AST: 29 IU/L (ref 0–40)
Albumin/Globulin Ratio: 1.8 (ref 1.2–2.2)
Albumin: 4.8 g/dL (ref 3.8–4.9)
Alkaline Phosphatase: 111 IU/L (ref 44–121)
BUN/Creatinine Ratio: 23 (ref 10–24)
BUN: 17 mg/dL (ref 8–27)
Bilirubin Total: 0.7 mg/dL (ref 0.0–1.2)
CO2: 23 mmol/L (ref 20–29)
Calcium: 10 mg/dL (ref 8.6–10.2)
Chloride: 97 mmol/L (ref 96–106)
Creatinine, Ser: 0.74 mg/dL — ABNORMAL LOW (ref 0.76–1.27)
Globulin, Total: 2.6 g/dL (ref 1.5–4.5)
Glucose: 104 mg/dL — ABNORMAL HIGH (ref 70–99)
Potassium: 4.1 mmol/L (ref 3.5–5.2)
Sodium: 139 mmol/L (ref 134–144)
Total Protein: 7.4 g/dL (ref 6.0–8.5)
eGFR: 104 mL/min/{1.73_m2} (ref >59)

## 2021-10-28 LAB — HEMOGLOBIN A1C
Est. average glucose Bld gHb Est-mCnc: 114 mg/dL
Hgb A1c MFr Bld: 5.6 % (ref 4.8–5.6)

## 2021-10-28 LAB — CBC
Hematocrit: 51.8 % — ABNORMAL HIGH (ref 37.5–51.0)
Hemoglobin: 17.5 g/dL (ref 13.0–17.7)
MCH: 29.7 pg (ref 26.6–33.0)
MCHC: 33.8 g/dL (ref 31.5–35.7)
MCV: 88 fL (ref 79–97)
Platelets: 227 10*3/uL (ref 150–450)
RBC: 5.9 x10E6/uL — ABNORMAL HIGH (ref 4.14–5.80)
RDW: 13 % (ref 11.6–15.4)
WBC: 5.6 10*3/uL (ref 3.4–10.8)

## 2021-10-28 LAB — HCV INTERPRETATION

## 2021-10-28 LAB — HCV AB W REFLEX TO QUANT PCR: HCV Ab: NONREACTIVE

## 2021-10-29 ENCOUNTER — Other Ambulatory Visit (INDEPENDENT_AMBULATORY_CARE_PROVIDER_SITE_OTHER): Payer: Self-pay | Admitting: Primary Care

## 2021-10-29 DIAGNOSIS — E782 Mixed hyperlipidemia: Secondary | ICD-10-CM

## 2021-10-29 DIAGNOSIS — G459 Transient cerebral ischemic attack, unspecified: Secondary | ICD-10-CM

## 2021-10-29 MED ORDER — ATORVASTATIN CALCIUM 80 MG PO TABS: 80.0000 mg | ORAL_TABLET | Freq: Every day | ORAL | 1 refills | Status: DC

## 2021-11-13 ENCOUNTER — Encounter (INDEPENDENT_AMBULATORY_CARE_PROVIDER_SITE_OTHER): Payer: Self-pay

## 2021-11-21 ENCOUNTER — Telehealth (INDEPENDENT_AMBULATORY_CARE_PROVIDER_SITE_OTHER): Payer: Self-pay | Admitting: Primary Care

## 2021-11-21 NOTE — Telephone Encounter (Signed)
Copied from [place] [id number]. Topic: General - Other >> Nov 21, 2021  3:38 PM Jacinto Reap M wrote: Reason for CRM: Pt son Bland Span stated he was returning a call regarding the pt most recent lab results. Cb# [phone]

## 2021-11-24 ENCOUNTER — Ambulatory Visit (INDEPENDENT_AMBULATORY_CARE_PROVIDER_SITE_OTHER): Payer: Self-pay | Admitting: *Deleted

## 2021-11-24 NOTE — Telephone Encounter (Signed)
Son, Lianne Cure called in with father in the background.   Son was interpreting for his father.  I read the lab result message from Nathaniel Mire, NP dated 10/29/2021 at 5:11 PM.  He is agreeable to taking the atorvastatin 80 mg.    Reason for Disposition  [1] Follow-up call to recent contact AND [2] information only call, no triage required  Answer Assessment - Initial Assessment Questions 1. REASON FOR CALL or QUESTION: "What is your reason for calling today?" or "How can I best help you?" or "What question do you have that I can help answer?"     Lab result message given  Protocols used: Information Only Call - No Triage-A-AH

## 2021-11-24 NOTE — Telephone Encounter (Signed)
Returned pt son Bland Span call and located DPR to make sure son is on it and he is. Provided son with results and per son he doesn't have any questions or concerns

## 2022-03-20 ENCOUNTER — Other Ambulatory Visit (INDEPENDENT_AMBULATORY_CARE_PROVIDER_SITE_OTHER): Payer: Self-pay | Admitting: Primary Care

## 2022-03-20 DIAGNOSIS — I10 Essential (primary) hypertension: Secondary | ICD-10-CM

## 2022-03-20 DIAGNOSIS — Z760 Encounter for issue of repeat prescription: Secondary | ICD-10-CM

## 2022-03-20 NOTE — Telephone Encounter (Signed)
Requested Prescriptions  Pending Prescriptions Disp Refills   hydrochlorothiazide (HYDRODIURIL) 25 MG tablet [Pharmacy Med Name: HYDROCHLOROTHIAZIDE 25 MG TAB] 90 tablet 0    Sig: TAKE 1 TABLET (25 MG TOTAL) BY MOUTH DAILY.     Cardiovascular: Diuretics - Thiazide Failed - 03/20/2022  1:56 AM      Failed - Cr in normal range and within 180 days    Creatinine, Ser  Date Value Ref Range Status  10/24/2021 0.74 (L) 0.76 - 1.27 mg/dL Final         Failed - Last BP in normal range    BP Readings from Last 1 Encounters:  10/24/21 (!) 154/81         Passed - K in normal range and within 180 days    Potassium  Date Value Ref Range Status  10/24/2021 4.1 3.5 - 5.2 mmol/L Final         Passed - Na in normal range and within 180 days    Sodium  Date Value Ref Range Status  10/24/2021 139 134 - 144 mmol/L Final         Passed - Valid encounter within last 6 months    Recent Outpatient Visits           4 months ago Essential hypertension   [place], [place] P, NP   7 months ago Essential hypertension   [place], [place] P, NP   1 year ago Essential hypertension   [place], Michelle P, NP   1 year ago TIA (transient ischemic attack)   [place], Michelle P, NP   1 year ago Need for Tdap vaccination   [place] [facility] Kerin Perna, NP       Future Appointments             In 1 month Oletta Lamas, [place] Cage, NP [place]

## 2022-03-20 NOTE — Telephone Encounter (Signed)
Requested Prescriptions  Pending Prescriptions Disp Refills   amLODipine (NORVASC) 10 MG tablet [Pharmacy Med Name: AMLODIPINE BESYLATE 10 MG TAB] 30 tablet 1    Sig: TAKE 1 TABLET BY MOUTH EVERY DAY     Cardiovascular: Calcium Channel Blockers 2 Failed - 03/20/2022  2:07 PM      Failed - Last BP in normal range    BP Readings from Last 1 Encounters:  10/24/21 (!) 154/81         Passed - Last Heart Rate in normal range    Pulse Readings from Last 1 Encounters:  10/24/21 73         Passed - Valid encounter within last 6 months    Recent Outpatient Visits           4 months ago Essential hypertension   [place], [place] P, NP   7 months ago Essential hypertension   [place], Michelle P, NP   1 year ago Essential hypertension   [place], Michelle P, NP   1 year ago TIA (transient ischemic attack)   [place], Michelle P, NP   1 year ago Need for Tdap vaccination   [place] [facility] Kerin Perna, NP       Future Appointments             In 1 month Oletta Lamas, [place] Cage, NP [place]

## 2022-04-17 ENCOUNTER — Other Ambulatory Visit (INDEPENDENT_AMBULATORY_CARE_PROVIDER_SITE_OTHER): Payer: Self-pay | Admitting: Primary Care

## 2022-04-17 DIAGNOSIS — G459 Transient cerebral ischemic attack, unspecified: Secondary | ICD-10-CM

## 2022-04-17 DIAGNOSIS — E782 Mixed hyperlipidemia: Secondary | ICD-10-CM

## 2022-04-24 ENCOUNTER — Ambulatory Visit (INDEPENDENT_AMBULATORY_CARE_PROVIDER_SITE_OTHER): Payer: 59 | Admitting: Primary Care

## 2022-04-24 ENCOUNTER — Encounter (INDEPENDENT_AMBULATORY_CARE_PROVIDER_SITE_OTHER): Payer: Self-pay | Admitting: Primary Care

## 2022-04-24 VITALS — BP 133/80 | HR 76 | Resp 16 | Ht 68.0 in | Wt 170.6 lb

## 2022-04-24 DIAGNOSIS — E782 Mixed hyperlipidemia: Secondary | ICD-10-CM | POA: Diagnosis not present

## 2022-04-24 DIAGNOSIS — I10 Essential (primary) hypertension: Secondary | ICD-10-CM | POA: Diagnosis not present

## 2022-04-24 DIAGNOSIS — Z1211 Encounter for screening for malignant neoplasm of colon: Secondary | ICD-10-CM

## 2022-04-24 NOTE — Progress Notes (Signed)
Nathaniel Hernandez, is a 62 y.o. male  [id number]  [id number]  DOB - 04-07-1960  Chief Complaint  Patient presents with   Hypertension       Subjective:   Nathaniel Hernandez is a 62 y.o. Hispanic normal weight male here today for a follow up visit for the management of hypertension.  Patient has given his son permission to be at his appointment and if needed provide interpreting Nathaniel Hernandez. Patient has No headache, No chest pain, No abdominal pain - No Nausea, No new weakness tingling or numbness, No Cough - shortness of breath  No problems updated.  No Known Allergies  Past Medical History:  Diagnosis Date   Hypertension     Current Outpatient Medications on File Prior to Visit  Medication Sig Dispense Refill   amLODipine (NORVASC) 10 MG tablet TAKE 1 TABLET BY MOUTH EVERY DAY 30 tablet 1   aspirin EC 81 MG tablet Take 81 mg by mouth daily. Swallow whole.     atorvastatin (LIPITOR) 80 MG tablet TAKE 1 TABLET BY MOUTH EVERY DAY 30 tablet 5   hydrochlorothiazide (HYDRODIURIL) 25 MG tablet TAKE 1 TABLET (25 MG TOTAL) BY MOUTH DAILY. 90 tablet 0   No current facility-administered medications on file prior to visit.    Objective:   Vitals:   04/24/22 0903  BP: 133/80  Pulse: 76  Resp: 16  SpO2: 98%  Weight: 170 lb 9.6 oz (77.4 kg)  Height: 5\' 8"  (1.727 m)    Exam General appearance : Awake, alert, not in any distress. Speech Clear. Not toxic looking HEENT: Atraumatic and Normocephalic, pupils equally reactive to light and accomodation Neck: Supple, no JVD. No cervical lymphadenopathy.  Chest: Good air entry bilaterally, no added sounds  CVS: S1 S2 regular, no murmurs.  Abdomen: Bowel sounds present, Non tender and not distended with no gaurding, rigidity or rebound. Extremities: B/L Lower Ext shows no edema, both legs are warm to touch Neurology: Awake alert, and oriented X 3, CN II-XII intact, Non focal Skin: No Rash  Data  Review Lab Results  Component Value Date   HGBA1C 5.6 10/24/2021   HGBA1C 5.4 09/01/2020    Assessment & Plan  Nathaniel Hernandez was seen today for hypertension.  Diagnoses and all orders for this visit:  Colon cancer screening -     Fecal occult blood, imunochemical -     Cologuard  Essential hypertension BP goal - <  140/90 well controlled  Explained that having normal blood pressure is the goal and medications are helping to get to goal and maintain normal blood pressure. DIET: Limit salt intake, read nutrition labels to check salt content, limit fried and high fatty foods  Avoid using multisymptom OTC cold preparations that generally contain sudafed which can rise BP. Consult with pharmacist on best cold relief products to use for persons with HTN EXERCISE Discussed incorporating exercise such as walking - 30 minutes most days of the week and can do in 10 minute intervals     Mixed hyperlipidemia onAtorvastatin 80 mg  -     Lipid Panel   Healthy lifestyle diet of fruits vegetables fish nuts whole grains and low saturated fat . Foods high in cholesterol or liver, fatty meats,cheese, butter avocados, nuts and seeds, chocolate and fried foods. - Lipid panel  Patient have been counseled extensively about nutrition and exercise. Other issues discussed during this visit include: low cholesterol diet, weight control and daily exercise, foot care, annual eye examinations at Ophthalmology,  importance of adherence with medications and regular follow-up. We also discussed long term complications of uncontrolled diabetes and hypertension.   Return in about 3 months (around 07/25/2022).  The patient was given clear instructions to go to ER or return to medical center if symptoms don't improve, worsen or new problems develop. The patient verbalized understanding. The patient was told to call to get lab results if they haven't heard anything in the next week.   This note has been created with Scientist, clinical (histocompatibility and immunogenetics). Any transcriptional errors are unintentional.   Kerin Perna, NP 04/24/2022, 9:55 AM

## 2022-04-24 NOTE — Patient Instructions (Signed)
Dislipidemia Dyslipidemia La dislipidemia es un desequilibrio de sustancias cerosas parecidas a la grasa (lpidos) en la sangre. El cuerpo necesita lpidos en pequeas cantidades. Con frecuencia, la dislipidemia implica un nivel alto de colesterol o triglicridos, que son tipos de lpidos. Las formas frecuentes de dislipidemia incluyen las siguientes: Niveles elevados de colesterol LDL. El LDL es el tipo de colesterol que causa la acumulacin de depsitos de grasa (placas) en los vasos sanguneos que transportan la sangre fuera del corazn (arterias). Niveles bajos de colesterol HDL. El HDL es el tipo de colesterol que brinda proteccin contra las enfermedades cardacas. Los niveles altos de HDL eliminan la acumulacin de LDL de las arterias. Niveles altos de triglicridos. Los triglicridos son una sustancia grasa presente en la sangre que se relaciona con la acumulacin de placa en las arterias. Cules son las causas? Hay dos tipos principales de dislipidemia: primaria y secundaria. La dislipidemia primaria es causada por cambios (mutaciones) en los genes que se transmiten a travs de las familias (se heredan). Estas mutaciones causan varios tipos de dislipidemia. La dislipidemia secundaria puede ser causada por diversos factores de riesgo que pueden provocar la enfermedad, como las opciones de estilo de vida y ciertas enfermedades. Qu incrementa el riesgo? Tiene ms probabilidades de desarrollar esta afeccin si es un hombre mayor o si es una mujer que ha pasado por la menopausia. Otros factores de riesgo son los siguientes: Tener antecedentes familiares de dislipidemia. Tomar determinados medicamentos, entre ellos, pldoras anticonceptivas, corticoesteroides, algunos diurticos y betabloqueantes. Seguir una dieta con alto contenido de grasas saturadas. Fumar cigarrillos o beber alcohol en exceso. Tener ciertas afecciones mdicas, como diabetes, sndrome de ovario poliqustico (SOP), enfermedad  renal, enfermedad heptica o hipotiroidismo. No hacer ejercicio regularmente. Tener sobrepeso o ser obeso con demasiada grasa en el abdomen. Cules son los signos o sntomas? En la mayora de los casos, la dislipidemia no causa ningn sntoma. En los casos graves, los niveles muy altos de lpidos pueden causar: Protuberancias de grasa debajo de la piel (xantomas). Un anillo blanco o gris alrededor del centro negro (pupila) del ojo. Los niveles muy altos de triglicridos pueden causar inflamacin del pncreas (pancreatitis). Cmo se diagnostica? Su mdico puede diagnosticar dislipidemia basndose en un anlisis de sangre de rutina (anlisis de sangre en ayunas). Como la mayora de las personas no tienen sntomas de la afeccin, este anlisis de sangre (perfil de lpidos) se realiza en adultos mayores de 20 aos y se repite cada 4 a 6 aos. En este anlisis, se controla lo siguiente: Colesterol total. Esto mide la cantidad total de colesterol en la sangre, que incluye el colesterol LDL, el colesterol HDL y los triglicridos. Un valor saludable est por debajo de 200 mg/dl (5.17 mmol/l). Colesterol LDL. El valor objetivo de colesterol LDL es diferente para cada persona, en funcin de los factores de riesgo individuales. Un valor saludable suele estar por debajo de 100 mg/dl (2.59 mmol/l). Consulte al mdico cul debe ser el valor del colesterol LDL para usted. Colesterol HDL. Un nivel de colesterol HDL de 60 mg/dl (1.55 mmol/l) o superior es lo mejor porque ayuda a proteger contra las enfermedades cardacas. Un valor por debajo de 40 mg/dl (1.03 mmol/l) para los hombres o por debajo de 50 mg/dl (1.29 mmol/l) para las mujeres aumenta el riesgo de enfermedad cardaca. Triglicridos. Un valor de triglicridos saludable est por debajo de 150 mg/dl (1.69 mmol/l). Si su perfil de lpidos es anormal, su mdico puede realizar otros anlisis de sangre. Cmo se trata? El tratamiento   depende del tipo de  dislipidemia que usted tenga y sus otros factores de riesgo de enfermedades cardacas o accidente cerebrovascular. Su mdico tendr un rango objetivo para sus niveles de lpidos en funcin de esta informacin. El tratamiento para la dislipidemia comienza con cambios en el estilo de vida, tales como dieta y ejercicio. El mdico podra recomendarle que haga lo siguiente: Hacer ejercicio con regularidad. Realizar cambios en la dieta. Si fuma, dejar de hacerlo. Limitar el consumo de bebidas alcohlicas. Si los cambios en la dieta y la actividad fsica no ayudan a alcanzar sus objetivos, el mdico tambin puede recetarle medicamentos para disminuir los lpidos. El tipo de medicamento recetado con ms frecuencia disminuye el colesterol LDL (estatinas). Si tiene un nivel alto de triglicridos, su mdico puede recetarle otro tipo de frmaco (fibratos) o un suplemento de aceite de pescado con omega-3, o ambos. Siga estas instrucciones en su casa: Comida y bebida  Siga las indicaciones del mdico o el nutricionista respecto de las restricciones para las comidas o las bebidas. Siga una dieta saludable como se lo haya indicado el mdico. Esto puede ayudarle a alcanzar y mantener un peso saludable, reducir el colesterol LDL y aumentar el colesterol HDL. Puede incluir: Limitar sus caloras, si tiene sobrepeso. Comer ms frutas, verduras, cereales integrales, pescado y carnes magras. Limitar las grasas saturadas, las grasas trans y el colesterol. No beba alcohol si: Su mdico le indica no hacerlo. Est embarazada, puede estar embarazada o est tratando de quedar embarazada. Si bebe alcohol: Limite la cantidad que bebe a lo siguiente: De 0 a 1 medida por da para las mujeres. De 0 a 2 medidas por da para los hombres. Sepa cunta cantidad de alcohol hay en las bebidas que toma. En los [place], una medida equivale a una botella de cerveza de 12 oz (355 ml), un vaso de vino de 5 oz (148 ml) o un vaso de  una bebida alcohlica de alta graduacin de 1 oz (44 ml). Actividad Haga ejercicio con regularidad. Siga un programa de ejercicio y entrenamiento de fuerza tal como se lo haya indicado el mdico. Pregntele al mdico qu actividades son seguras para usted. El mdico puede recomendarle lo siguiente: 30 minutos de actividad aerbica de 4 a 6 das por semana. La caminata a paso ligero es un ejemplo de actividad aerbica. Entrenamiento de fuerza 2 das por semana. Indicaciones generales No consuma ningn producto que contenga nicotina o tabaco. Estos productos incluyen cigarrillos, tabaco para mascar y aparatos de vapeo, como los cigarrillos electrnicos. Si necesita ayuda para dejar de consumir estos productos, consulte al mdico. Use los medicamentos de venta libre y los recetados solamente como se lo haya indicado el mdico. Esto incluye los suplementos. Concurra a todas las visitas de seguimiento. Esto es importante. Comunquese con un mdico si: Tiene dificultad para cumplir con su plan de actividad fsica o su dieta. Le cuesta dejar de fumar o controlar el consumo de alcohol. Resumen Con frecuencia, la dislipidemia implica un nivel alto de colesterol o triglicridos, que son tipos de lpidos. El tratamiento depende del tipo de dislipidemia que usted tenga y sus otros factores de riesgo de enfermedades cardacas o accidente cerebrovascular. El tratamiento para la dislipidemia comienza con cambios en el estilo de vida, tales como dieta y ejercicio. Su mdico puede recetarle medicamentos para disminuir los lpidos. Esta informacin no tiene como fin reemplazar el consejo del mdico. Asegrese de hacerle al mdico cualquier pregunta que tenga. Document Revised: 03/29/2020 Document Reviewed: 03/29/2020 Elsevier Patient   Education  2023 [employer]  

## 2022-04-25 ENCOUNTER — Other Ambulatory Visit (INDEPENDENT_AMBULATORY_CARE_PROVIDER_SITE_OTHER): Payer: Self-pay | Admitting: Primary Care

## 2022-04-25 DIAGNOSIS — E782 Mixed hyperlipidemia: Secondary | ICD-10-CM

## 2022-04-25 LAB — LIPID PANEL
Chol/HDL Ratio: 2.7 ratio (ref 0.0–5.0)
Cholesterol, Total: 163 mg/dL (ref 100–199)
HDL: 60 mg/dL (ref >39)
LDL Chol Calc (NIH): 84 mg/dL (ref 0–99)
Triglycerides: 103 mg/dL (ref 0–149)
VLDL Cholesterol Cal: 19 mg/dL (ref 5–40)

## 2022-04-25 MED ORDER — ATORVASTATIN CALCIUM 40 MG PO TABS: 40.0000 mg | ORAL_TABLET | Freq: Every day | ORAL | 3 refills | Status: DC

## 2022-05-10 ENCOUNTER — Other Ambulatory Visit (INDEPENDENT_AMBULATORY_CARE_PROVIDER_SITE_OTHER): Payer: Self-pay | Admitting: Primary Care

## 2022-05-10 DIAGNOSIS — I10 Essential (primary) hypertension: Secondary | ICD-10-CM

## 2022-05-10 DIAGNOSIS — Z760 Encounter for issue of repeat prescription: Secondary | ICD-10-CM

## 2022-05-10 LAB — COLOGUARD: COLOGUARD: NEGATIVE

## 2022-05-15 ENCOUNTER — Encounter (INDEPENDENT_AMBULATORY_CARE_PROVIDER_SITE_OTHER): Payer: Self-pay

## 2022-05-18 ENCOUNTER — Telehealth (INDEPENDENT_AMBULATORY_CARE_PROVIDER_SITE_OTHER): Payer: Self-pay | Admitting: Primary Care

## 2022-05-18 NOTE — Telephone Encounter (Signed)
Patient called to get lab results, results given and patient verbal understanding.

## 2022-05-22 ENCOUNTER — Ambulatory Visit (INDEPENDENT_AMBULATORY_CARE_PROVIDER_SITE_OTHER): Payer: Self-pay | Admitting: *Deleted

## 2022-05-22 NOTE — Telephone Encounter (Signed)
Pt returned call and was given his lab result message from Gwinda Passe, NP dated 04/25/2022 at 12:34 PM.  I let him know his rx had been called into the [facility] on [address] for the dose of 40 mg.    No questions from pt. Reason for Disposition  [1] Follow-up call to recent contact AND [2] information only call, no triage required  Answer Assessment - Initial Assessment Questions 1. REASON FOR CALL or QUESTION: "What is your reason for calling today?" or "How can I best help you?" or "What question do you have that I can help answer?"     Pt returned call and was given his lab result message.  Protocols used: Information Only Call - No Triage-A-AH

## 2022-05-22 NOTE — Telephone Encounter (Signed)
  Chief Complaint: Pt called in and was given his lab result message from Gwinda Passe, NP dated 04/25/2022 at 12:34 PM. Symptoms: N/A Frequency: N/A Pertinent Negatives: Patient denies N/A Disposition: [] ED /[] Urgent Care (no appt availability in office) / [] Appointment(In office/virtual)/ []  [place] [facility]/ [x] Home Care/ [] Refused Recommended Disposition /[] [place]/ []  Follow-up with PCP Additional Notes: Lab result given

## 2022-06-14 ENCOUNTER — Other Ambulatory Visit (INDEPENDENT_AMBULATORY_CARE_PROVIDER_SITE_OTHER): Payer: Self-pay | Admitting: Primary Care

## 2022-06-14 DIAGNOSIS — Z760 Encounter for issue of repeat prescription: Secondary | ICD-10-CM

## 2022-06-14 DIAGNOSIS — I10 Essential (primary) hypertension: Secondary | ICD-10-CM

## 2022-07-27 ENCOUNTER — Ambulatory Visit (INDEPENDENT_AMBULATORY_CARE_PROVIDER_SITE_OTHER): Payer: Self-pay | Admitting: Primary Care

## 2022-08-05 ENCOUNTER — Ambulatory Visit (INDEPENDENT_AMBULATORY_CARE_PROVIDER_SITE_OTHER): Payer: 59 | Admitting: Primary Care

## 2022-08-05 ENCOUNTER — Encounter (INDEPENDENT_AMBULATORY_CARE_PROVIDER_SITE_OTHER): Payer: Self-pay | Admitting: Primary Care

## 2022-08-05 DIAGNOSIS — Z760 Encounter for issue of repeat prescription: Secondary | ICD-10-CM

## 2022-08-05 DIAGNOSIS — I10 Essential (primary) hypertension: Secondary | ICD-10-CM | POA: Diagnosis not present

## 2022-08-05 MED ORDER — HYDROCHLOROTHIAZIDE 25 MG PO TABS: 25.0000 mg | ORAL_TABLET | Freq: Every day | ORAL | 1 refills | Status: DC

## 2022-08-05 NOTE — Patient Instructions (Signed)
Hipertensin en los adultos Hypertension, Adult El trmino hipertensin es otra forma de denominar a la presin arterial elevada. La presin arterial elevada fuerza al corazn a trabajar ms para bombear la sangre. Esto puede causar problemas con el paso del tiempo. Una lectura de presin arterial est compuesta por 2 nmeros. Hay un nmero superior (sistlico) sobre un nmero inferior (diastlico). Lo ideal es tener la presin arterial por debajo de 120/80. Cules son las causas? Se desconoce la causa de esta afeccin. Algunas otras afecciones pueden provocar presin arterial elevada. Qu incrementa el riesgo? Algunos factores del estilo de vida pueden hacer que tenga ms probabilidades de desarrollar presin arterial elevada: Fumar. No hacer la cantidad suficiente de actividad fsica o ejercicio. Tener sobrepeso. Consumir mucha grasa, azcar, caloras o sal (sodio) en su dieta. Beber alcohol en exceso. Otros factores de riesgo son los siguientes: Tener alguna de estas afecciones: Enfermedad cardaca. Diabetes. Colesterol alto. Enfermedad renal. Apnea obstructiva del sueo. Tener antecedentes familiares de presin arterial elevada y colesterol elevado. Edad. El riesgo aumenta con la edad. Estrs. Cules son los signos o sntomas? Es posible que la presin arterial alta no cause sntomas. La presin arterial muy alta (crisis hipertensiva) puede provocar: Dolor de cabeza. Latidos cardacos acelerados o irregulares (palpitaciones). Falta de aire. Hemorragia nasal. Vomitar o sentir ganas de vomitar (nuseas). Cambios en la forma de ver. Dolor muy intenso en el pecho. Sensacin de mareo. Convulsiones. Cmo se trata? Esta afeccin se trata haciendo cambios saludables en el estilo de vida, por ejemplo: Consumir alimentos saludables. Hacer ms ejercicio. Beber menos alcohol. El mdico puede recetarle medicamentos si los cambios en el estilo de vida no son lo suficientemente  eficaces y si: El nmero de arriba est por encima de 130. El nmero de abajo est por encima de 80. Su presin arterial personal ideal puede variar. Siga estas indicaciones en su casa: Comida y bebida  Si se lo dicen, siga el plan de alimentacin de DASH (Dietary Approaches to Stop Hypertension, Maneras de alimentarse para detener la hipertensin). Para seguir este plan: Llene la mitad del plato de cada comida con frutas y verduras. Llene un cuarto del plato de cada comida con cereales integrales. Los cereales integrales incluyen pasta integral, arroz integral y pan integral. Coma y beba productos lcteos con bajo contenido de grasa, como leche descremada o yogur bajo en grasas. Llene un cuarto del plato de cada comida con protenas bajas en grasa (magras). Las protenas bajas en grasa incluyen pescado, pollo sin piel, huevos, frijoles y tofu. Evite consumir carne grasa, carne curada y procesada, o pollo con piel. Evite consumir alimentos prehechos o procesados. Limite la cantidad de sal en su dieta a menos de 1500 mg por da. No beba alcohol si: El mdico le indica que no lo haga. Est embarazada, puede estar embarazada o est tratando de quedar embarazada. Si bebe alcohol: Limite la cantidad que bebe a lo siguiente: De 0 a 1 medida por da para las mujeres. De 0 a 2 medidas por da para los hombres. Sepa cunta cantidad de alcohol hay en las bebidas que toma. En los [place], una medida equivale a una botella de cerveza de 12 oz (355 ml), un vaso de vino de 5 oz (148 ml) o un vaso de una bebida alcohlica de alta graduacin de 1 oz (44 ml). Estilo de vida  Trabaje con su mdico para mantenerse en un peso saludable o para perder peso. Pregntele a su mdico cul es el peso recomendable para   usted. Realice al menos 30 minutos de ejercicio que haga que se acelere su corazn (ejercicio aerbico) la mayora de los das de la semana. Estos pueden incluir caminar, nadar o andar en  bicicleta. Realice al menos 30 minutos de ejercicio que fortalezca sus msculos (ejercicios de resistencia) al menos 3 das a la semana. Estos pueden incluir levantar pesas o hacer Pilates. No fume ni consuma ningn producto que contenga nicotina o tabaco. Si necesita ayuda para dejar de consumir estos productos, consulte al mdico. Controle su presin arterial en su casa tal como le indic el mdico. Concurra a todas las visitas de seguimiento. Medicamentos Use los medicamentos de venta libre y los recetados solamente como se lo haya indicado el mdico. Siga cuidadosamente las indicaciones. No omita las dosis de medicamentos para la presin arterial. Los medicamentos pierden eficacia si omite dosis. El hecho de omitir las dosis tambin aumenta el riesgo de otros problemas. Pregntele a su mdico a qu efectos secundarios o reacciones a los medicamentos debe prestar atencin. Comunquese con un mdico si: Piensa que tiene una reaccin a los medicamentos que est tomando. Tiene dolores de cabeza frecuentes. Siente mareos. Tiene hinchazn en los tobillos. Tiene problemas de visin. Solicite ayuda de inmediato si: Siente un dolor de cabeza muy intenso. Empieza a sentirse desorientado (confundido). Se siente dbil o adormecido. Siente que va a desmayarse. Tiene un dolor muy intenso en: Pecho. Vientre (abdomen). Vomita ms de una vez. Tiene dificultad para respirar. Estos sntomas pueden indicar una emergencia. Solicite ayuda de inmediato. Llame al 911. No espere a ver si los sntomas desaparecen. No conduzca por sus propios medios hasta el hospital. Resumen El trmino hipertensin es otra forma de denominar a la presin arterial elevada. La presin arterial elevada fuerza al corazn a trabajar ms para bombear la sangre. Para la mayora de las personas, una presin arterial normal es menor que 120/80. Las decisiones saludables pueden ayudarle a disminuir su presin arterial. Si no puede  bajar su presin arterial mediante decisiones saludables, es posible que deba tomar medicamentos. Esta informacin no tiene como fin reemplazar el consejo del mdico. Asegrese de hacerle al mdico cualquier pregunta que tenga. Document Revised: 11/21/2020 Document Reviewed: 11/21/2020 Elsevier Patient Education  2024 [employer] Inc.  

## 2022-08-10 NOTE — Progress Notes (Signed)
[facility]  Nathaniel Hernandez, is a 62 y.o. male  [id number]  [id number]  DOB - 19-Dec-1960  Chief Complaint  Patient presents with   Hypertension       Subjective:   Nathaniel Hernandez is a 62 y.o. male here today for a follow up visi management of HTN. Patient has No headache, No chest pain, No abdominal pain - No Nausea, No new weakness tingling or numbness, No Cough - shortness of breath  No problems updated.  No Known Allergies  Past Medical History:  Diagnosis Date   Hypertension     Current Outpatient Medications on File Prior to Visit  Medication Sig Dispense Refill   amLODipine (NORVASC) 10 MG tablet TAKE 1 TABLET BY MOUTH EVERY DAY 90 tablet 1   aspirin EC 81 MG tablet Take 81 mg by mouth daily. Swallow whole.     atorvastatin (LIPITOR) 40 MG tablet Take 1 tablet (40 mg total) by mouth daily. 90 tablet 3   No current facility-administered medications on file prior to visit.    Objective:   Vitals:   08/05/22 1639 08/05/22 1640 08/05/22 1641  BP: (Abnormal) 158/78 (Abnormal) 144/75 131/79  Pulse: 86    Resp: 16    SpO2: 98%    Weight: 170 lb 3.2 oz (77.2 kg)      Comprehensive ROS Pertinent positive and negative noted in HPI   Exam General appearance : Awake, alert, not in any distress. Speech Clear. Not toxic looking HEENT: Atraumatic and Normocephalic, pupils equally reactive to light and accomodation Neck: Supple, no JVD. No cervical lymphadenopathy.  Chest: Good air entry bilaterally, no added sounds  CVS: S1 S2 regular, no murmurs.  Abdomen: Bowel sounds present, Non tender and not distended with no gaurding, rigidity or rebound. Extremities: B/L Lower Ext shows no edema, both legs are warm to touch Neurology: Awake alert, and oriented X 3, CN II-XII intact, Non focal Skin: No Rash  Data Review Lab Results  Component Value Date   HGBA1C 5.6 10/24/2021   HGBA1C 5.4 09/01/2020    Assessment & Plan  [name] was seen  today for hypertension.  Diagnoses and all orders for this visit:  Essential hypertension 2/2 Encounter for medication refill BP goal - < 130/80 Explained that having normal blood pressure is the goal and medications are helping to get to goal and maintain normal blood pressure. DIET: Limit salt intake, read nutrition labels to check salt content, limit fried and high fatty foods  Avoid using multisymptom OTC cold preparations that generally contain sudafed which can rise BP. Consult with pharmacist on best cold relief products to use for persons with HTN EXERCISE Discussed incorporating exercise such as walking - 30 minutes most days of the week and can do in 10 minute intervals    -     hydrochlorothiazide (HYDRODIURIL) 25 MG tablet; Take 1 tablet (25 mg total) by mouth daily.  Patient have been counseled extensively about nutrition and exercise. Other issues discussed during this visit include: low cholesterol diet, weight control and daily exercise, foot care, annual eye examinations at Ophthalmology, importance of adherence with medications and regular follow-up. We also discussed long term complications of uncontrolled diabetes and hypertension.   Return in about 6 months (around 02/05/2023) for Fasting.  The patient was given clear instructions to go to ER or return to medical center if symptoms don't improve, worsen or new problems develop. The patient verbalized understanding. The patient was told to call to get lab results  if they haven't heard anything in the next week.   This note has been created with Education officer, environmental. Any transcriptional errors are unintentional.   Grayce Sessions, NP 08/10/2022, 3:52 PM

## 2022-12-06 ENCOUNTER — Other Ambulatory Visit (INDEPENDENT_AMBULATORY_CARE_PROVIDER_SITE_OTHER): Payer: Self-pay | Admitting: Primary Care

## 2022-12-06 DIAGNOSIS — Z760 Encounter for issue of repeat prescription: Secondary | ICD-10-CM

## 2022-12-06 DIAGNOSIS — I10 Essential (primary) hypertension: Secondary | ICD-10-CM

## 2022-12-06 NOTE — Telephone Encounter (Signed)
Hypertension

## 2023-02-09 ENCOUNTER — Ambulatory Visit (INDEPENDENT_AMBULATORY_CARE_PROVIDER_SITE_OTHER): Payer: 59 | Admitting: Primary Care

## 2023-02-09 ENCOUNTER — Encounter (INDEPENDENT_AMBULATORY_CARE_PROVIDER_SITE_OTHER): Payer: Self-pay | Admitting: Primary Care

## 2023-02-09 VITALS — BP 136/84 | HR 86 | Resp 16 | Ht 68.0 in | Wt 173.6 lb

## 2023-02-09 DIAGNOSIS — R7303 Prediabetes: Secondary | ICD-10-CM

## 2023-02-09 DIAGNOSIS — E782 Mixed hyperlipidemia: Secondary | ICD-10-CM

## 2023-02-09 DIAGNOSIS — R351 Nocturia: Secondary | ICD-10-CM | POA: Diagnosis not present

## 2023-02-09 DIAGNOSIS — I10 Essential (primary) hypertension: Secondary | ICD-10-CM | POA: Diagnosis not present

## 2023-02-09 DIAGNOSIS — Z760 Encounter for issue of repeat prescription: Secondary | ICD-10-CM

## 2023-02-09 DIAGNOSIS — Z2821 Immunization not carried out because of patient refusal: Secondary | ICD-10-CM

## 2023-02-09 MED ORDER — HYDROCHLOROTHIAZIDE 25 MG PO TABS: 25.0000 mg | ORAL_TABLET | Freq: Every day | ORAL | 1 refills | Status: DC

## 2023-02-09 NOTE — Progress Notes (Signed)
 Subjective:  Patient ID: Nathaniel Hernandez, male    DOB: 03-11-1960  Age: 63 y.o. MRN: [id number]  CC: Hypertension and Prediabetes   [name] presents forFollow-up of diabetes. Patient does not check blood sugar at home HPI   Pertinent ROS:  Polyuria - No Polydipsia - No Vision problems - No  Medications as noted below. Taking them regularly without complication/adverse reaction being reported today.   History Kadrian has a past medical history of Hypertension.   He has a past surgical history that includes TEE without cardioversion (N/A, 09/03/2020) and Bubble study (09/03/2020).   His family history includes Stroke in his father.He reports that he has never smoked. He has never used smokeless tobacco. He reports current alcohol use. He reports that he does not use drugs.  Current Outpatient Medications on File Prior to Visit  Medication Sig Dispense Refill   amLODipine  (NORVASC ) 10 MG tablet TAKE 1 TABLET BY MOUTH EVERY DAY 90 tablet 1   aspirin  EC 81 MG tablet Take 81 mg by mouth daily. Swallow whole.     atorvastatin  (LIPITOR ) 40 MG tablet Take 1 tablet (40 mg total) by mouth daily. 90 tablet 3   No current facility-administered medications on file prior to visit.    Review of Systems Comprehensive ROS Pertinent positive and negative noted in HPI   Objective:  BP 136/84   Pulse 86   Resp 16   Ht 5' 8 (1.727 m)   Wt 173 lb 9.6 oz (78.7 kg)   SpO2 98%   BMI 26.40 kg/m   BP Readings from Last 3 Encounters:  02/09/23 136/84  08/05/22 131/79  04/24/22 133/80    Wt Readings from Last 3 Encounters:  02/09/23 173 lb 9.6 oz (78.7 kg)  08/05/22 170 lb 3.2 oz (77.2 kg)  04/24/22 170 lb 9.6 oz (77.4 kg)    Physical Exam Vitals reviewed.  Constitutional:      Appearance: Normal appearance.  HENT:     Right Ear: Tympanic membrane and external ear normal.     Left Ear: Tympanic membrane and external ear normal.     Nose: Nose normal.  Eyes:     Extraocular  Movements: Extraocular movements intact.  Cardiovascular:     Rate and Rhythm: Normal rate and regular rhythm.  Pulmonary:     Effort: Pulmonary effort is normal.     Breath sounds: Normal breath sounds.  Abdominal:     General: Bowel sounds are normal.     Palpations: Abdomen is soft.  Skin:    General: Skin is warm and dry.  Neurological:     Mental Status: He is alert and oriented to person, place, and time.  Psychiatric:        Mood and Affect: Mood normal.        Behavior: Behavior normal.     Lab Results  Component Value Date   HGBA1C 5.6 02/09/2023   HGBA1C 5.6 10/24/2021   HGBA1C 5.4 09/01/2020    Lab Results  Component Value Date   WBC 6.6 02/09/2023   HGB 18.0 (H) 02/09/2023   HCT 53.3 (H) 02/09/2023   PLT 235 02/09/2023   GLUCOSE 102 (H) 02/09/2023   CHOL 188 02/09/2023   TRIG 129 02/09/2023   HDL 57 02/09/2023   LDLDIRECT 149.9 (H) 09/01/2020   LDLCALC 108 (H) 02/09/2023   ALT 50 (H) 02/09/2023   AST 33 02/09/2023   NA 140 02/09/2023   K 4.5 02/09/2023   CL 97 02/09/2023  CREATININE 0.83 02/09/2023   BUN 12 02/09/2023   CO2 25 02/09/2023   INR 0.9 09/01/2020   HGBA1C 5.6 02/09/2023     Assessment & Plan:  Darrelle was seen today for hypertension and prediabetes.  Diagnoses and all orders for this visit:  Essential hypertension -     CBC with Differential/Platelet -     CMP14+EGFR -     hydrochlorothiazide  (HYDRODIURIL ) 25 MG tablet; Take 1 tablet (25 mg total) by mouth daily.  Mixed hyperlipidemia -     Lipid panel  Prediabetes -     CMP14+EGFR -     Hemoglobin A1c  Nocturia -     PSA  Herpes zoster vaccination declined  Influenza vaccination declined  Encounter for medication refill -     hydrochlorothiazide  (HYDRODIURIL ) 25 MG tablet; Take 1 tablet (25 mg total) by mouth daily.      Follow-up:  Return in about 3 months (around 05/10/2023).  The above assessment and management plan was discussed with the patient. The  patient verbalized understanding of and has agreed to the management plan. Patient is aware to call the clinic if symptoms fail to improve or worsen. Patient is aware when to return to the clinic for a follow-up visit. Patient educated on when it is appropriate to go to the emergency department.   Rosaline Bohr, NP-C

## 2023-02-10 LAB — LIPID PANEL
Chol/HDL Ratio: 3.3 {ratio} (ref 0.0–5.0)
Cholesterol, Total: 188 mg/dL (ref 100–199)
HDL: 57 mg/dL (ref >39)
LDL Chol Calc (NIH): 108 mg/dL — ABNORMAL HIGH (ref 0–99)
Triglycerides: 129 mg/dL (ref 0–149)
VLDL Cholesterol Cal: 23 mg/dL (ref 5–40)

## 2023-02-10 LAB — CBC WITH DIFFERENTIAL/PLATELET
Basophils Absolute: 0 10*3/uL (ref 0.0–0.2)
Basos: 1 %
EOS (ABSOLUTE): 0 10*3/uL (ref 0.0–0.4)
Eos: 0 %
Hematocrit: 53.3 % — ABNORMAL HIGH (ref 37.5–51.0)
Hemoglobin: 18 g/dL — ABNORMAL HIGH (ref 13.0–17.7)
Immature Grans (Abs): 0 10*3/uL (ref 0.0–0.1)
Immature Granulocytes: 1 %
Lymphocytes Absolute: 1.3 10*3/uL (ref 0.7–3.1)
Lymphs: 20 %
MCH: 29.7 pg (ref 26.6–33.0)
MCHC: 33.8 g/dL (ref 31.5–35.7)
MCV: 88 fL (ref 79–97)
Monocytes Absolute: 0.5 10*3/uL (ref 0.1–0.9)
Monocytes: 8 %
Neutrophils Absolute: 4.7 10*3/uL (ref 1.4–7.0)
Neutrophils: 70 %
Platelets: 235 10*3/uL (ref 150–450)
RBC: 6.06 x10E6/uL — ABNORMAL HIGH (ref 4.14–5.80)
RDW: 12.5 % (ref 11.6–15.4)
WBC: 6.6 10*3/uL (ref 3.4–10.8)

## 2023-02-10 LAB — CMP14+EGFR
ALT: 50 [IU]/L — ABNORMAL HIGH (ref 0–44)
AST: 33 [IU]/L (ref 0–40)
Albumin: 4.9 g/dL (ref 3.9–4.9)
Alkaline Phosphatase: 118 [IU]/L (ref 44–121)
BUN/Creatinine Ratio: 14 (ref 10–24)
BUN: 12 mg/dL (ref 8–27)
Bilirubin Total: 1 mg/dL (ref 0.0–1.2)
CO2: 25 mmol/L (ref 20–29)
Calcium: 9.8 mg/dL (ref 8.6–10.2)
Chloride: 97 mmol/L (ref 96–106)
Creatinine, Ser: 0.83 mg/dL (ref 0.76–1.27)
Globulin, Total: 2.5 g/dL (ref 1.5–4.5)
Glucose: 102 mg/dL — ABNORMAL HIGH (ref 70–99)
Potassium: 4.5 mmol/L (ref 3.5–5.2)
Sodium: 140 mmol/L (ref 134–144)
Total Protein: 7.4 g/dL (ref 6.0–8.5)
eGFR: 100 mL/min/{1.73_m2} (ref >59)

## 2023-02-10 LAB — PSA: Prostate Specific Ag, Serum: 1.8 ng/mL (ref 0.0–4.0)

## 2023-02-10 LAB — HEMOGLOBIN A1C
Est. average glucose Bld gHb Est-mCnc: 114 mg/dL
Hgb A1c MFr Bld: 5.6 % (ref 4.8–5.6)

## 2023-03-30 IMAGING — CT CT HEAD CODE STROKE
3 series · 14 of 47 positions shown, 16 images · non-contrast
Comparison: None.

CLINICAL DATA: Code stroke.  Right-sided deficits

EXAM:
CT HEAD WITHOUT CONTRAST
CT ANGIOGRAPHY OF THE HEAD AND NECK
TECHNIQUE: Contiguous axial images were obtained from the base of the skull
through the vertex without intravenous contrast. Multidetector CT
imaging of the head and neck was performed using the standard
protocol during bolus administration of intravenous contrast.
Multiplanar CT image reconstructions and MIPs were obtained to
evaluate the vascular anatomy. Carotid stenosis measurements (when
applicable) are obtained utilizing NASCET criteria, using the distal
internal carotid diameter as the denominator.

[Series 3: head 5.0 st · axial · 0.42mm/px · z∈[-167,-32]mm · 8 of 33 slices shown, 10 images]
[im 3/33  brain]
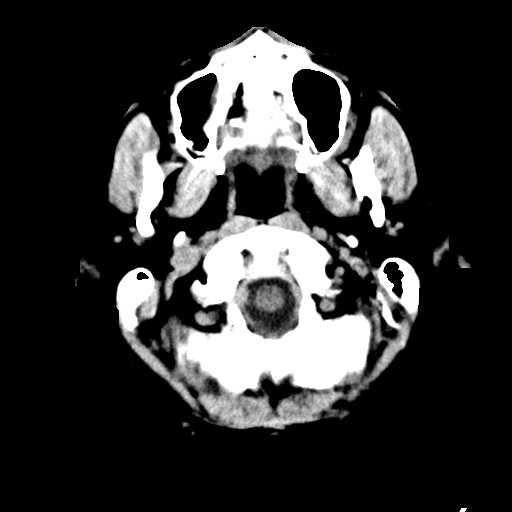
[im 3/33  bone]
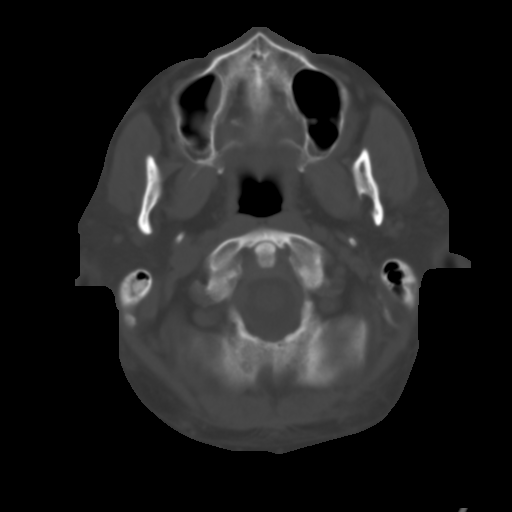
[im 7/33  brain]
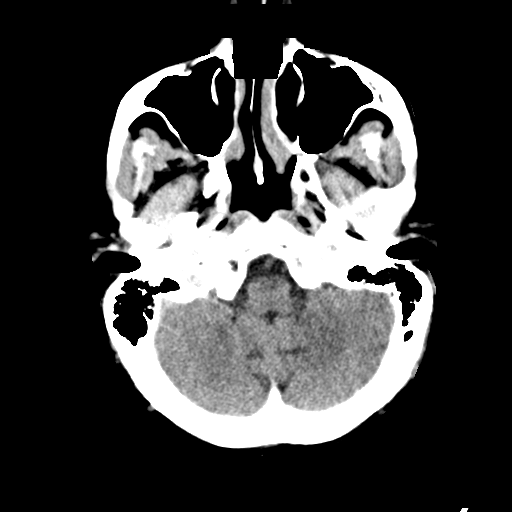
[im 10/33  brain]
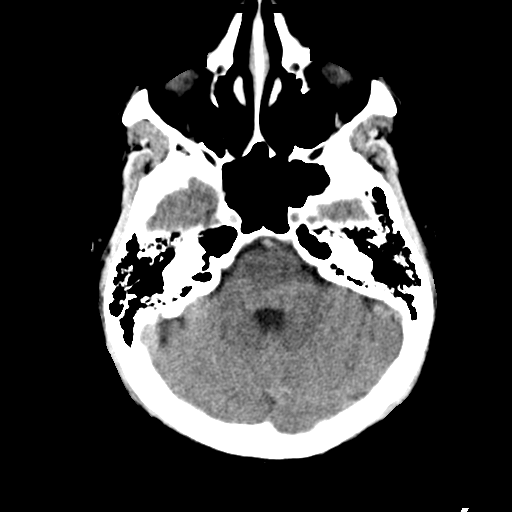
[im 15/33  brain]
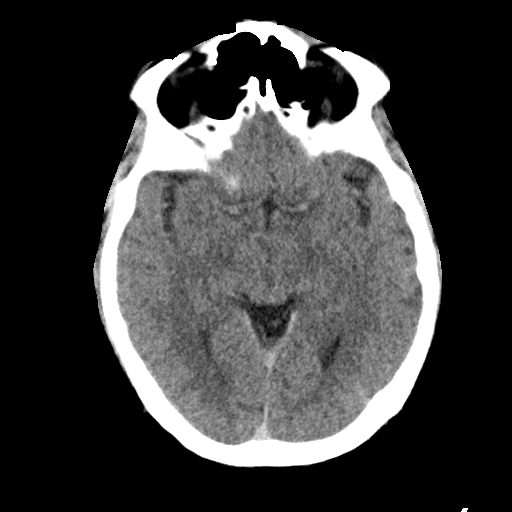
[im 18/33  brain]
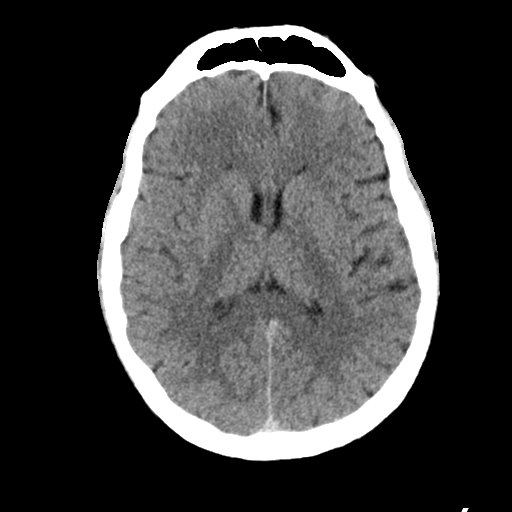
[im 18/33  bone]
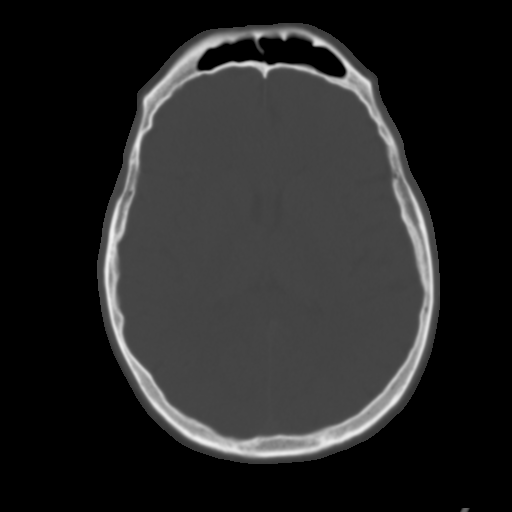
[im 23/33  brain]
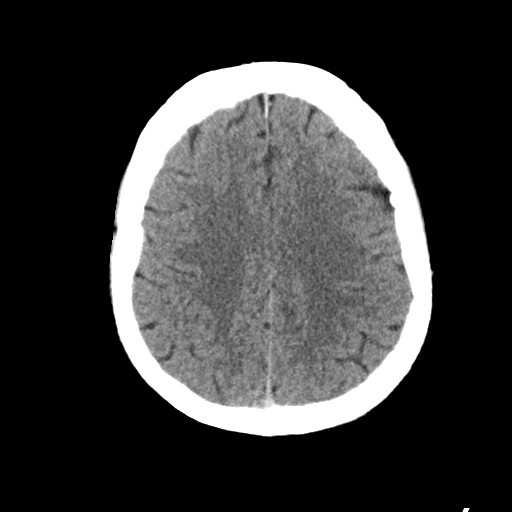
[im 26/33  brain]
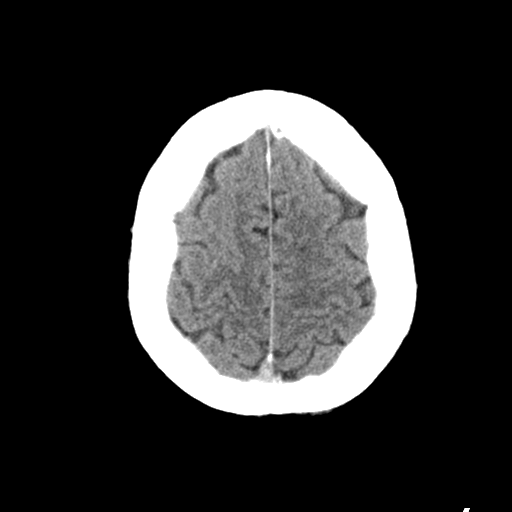
[im 30/33  brain]
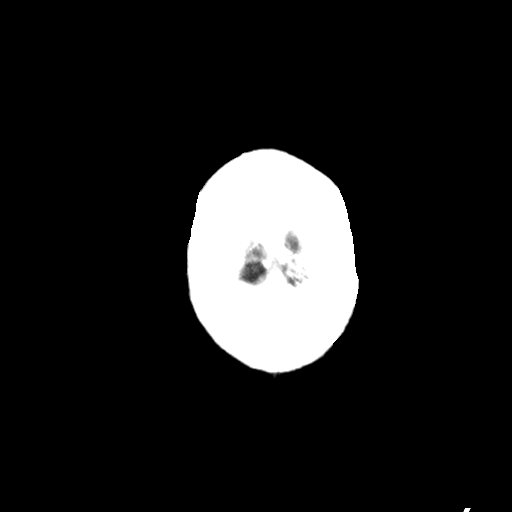

[Series 5: head 3.0 cor st · coronal · 0.31mm/px · 3 of 67 slices shown]
[im 25/67  brain]
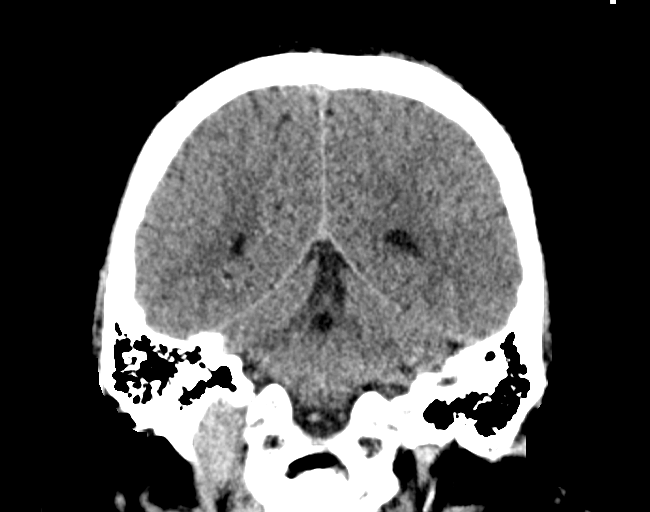
[im 31/67  brain]
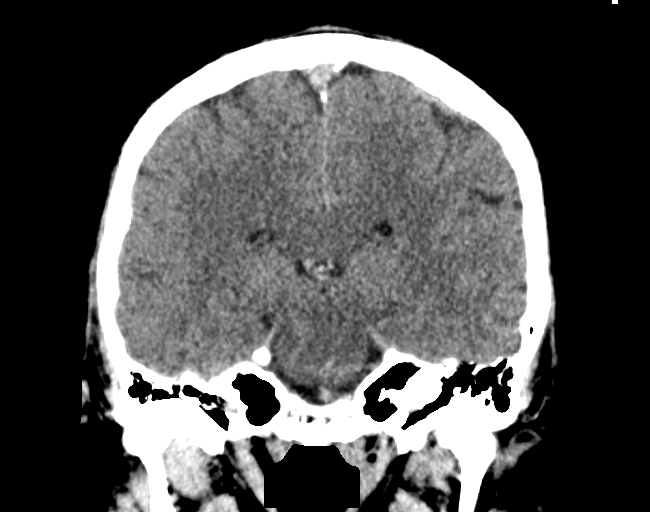
[im 36/67  brain]
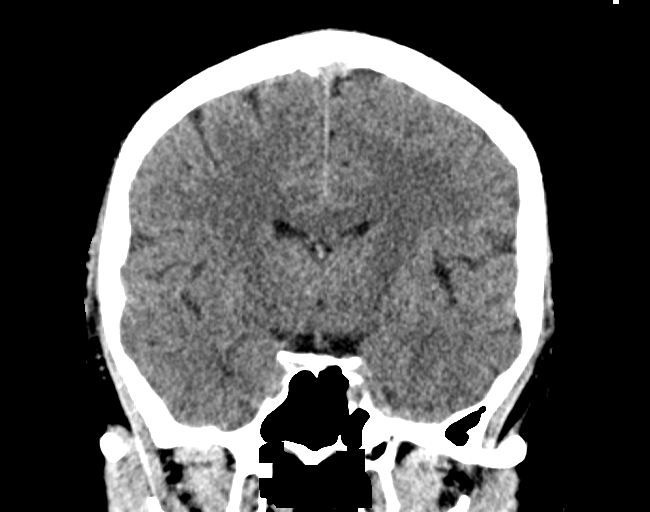

[Series 6: head 3.0 sag st · sagittal · 0.31mm/px · 3 of 61 slices shown]
[im 21/61  brain]
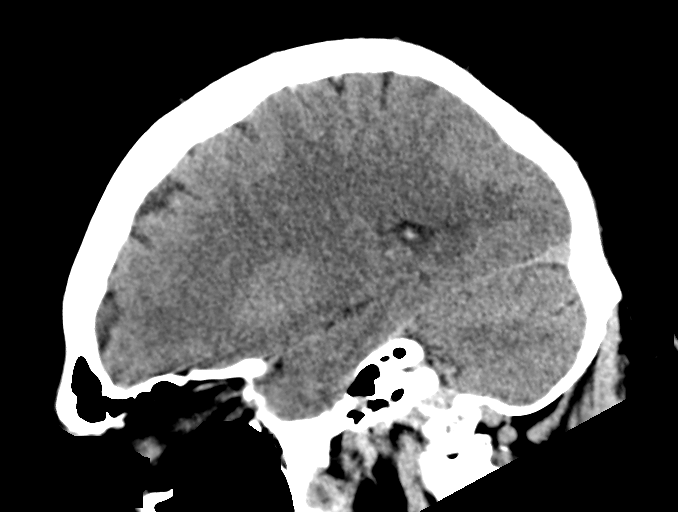
[im 31/61  brain]
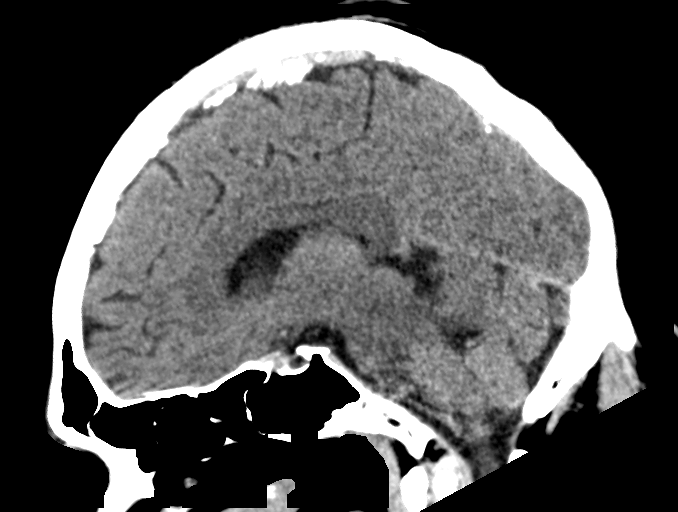
[im 41/61  brain]
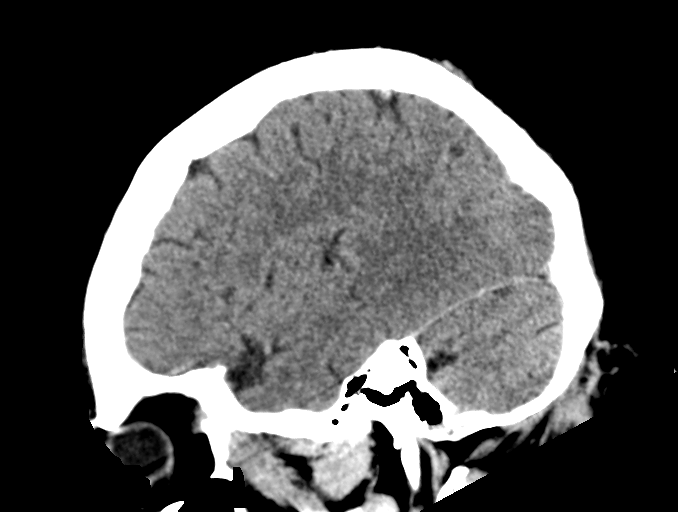

[14 of 47 positions shown; findings below may reference images not displayed]

FINDINGS: CT HEAD FINDINGS

Brain: There is no mass, hemorrhage or extra-axial collection. The
size and configuration of the ventricles and extra-axial CSF spaces
are normal. The brain parenchyma is normal, without evidence of
acute or chronic infarction.

Vascular: No abnormal hyperdensity of the major intracranial
arteries or dural venous sinuses. No intracranial atherosclerosis.

Skull: The visualized skull base, calvarium and extracranial soft
tissues are normal.

Sinuses/Orbits: No fluid levels or advanced mucosal thickening of
the visualized paranasal sinuses. No mastoid or middle ear effusion.
The orbits are normal.

ASPECTS (Alberta Stroke Program Early CT Score)

- Ganglionic level infarction (caudate, lentiform nuclei, internal
capsule, insula, M1-M3 cortex): 7

- Supraganglionic infarction (M4-M6 cortex): 3

Total score (0-10 with 10 being normal): 10

CTA NECK FINDINGS

SKELETON: There is no bony spinal canal stenosis. No lytic or
blastic lesion.

OTHER NECK: Normal pharynx, larynx and major salivary glands. No
cervical lymphadenopathy. Unremarkable thyroid gland.

UPPER CHEST: No pneumothorax or pleural effusion. No nodules or
masses.

AORTIC ARCH:

There is no calcific atherosclerosis of the aortic arch. There is no
aneurysm, dissection or hemodynamically significant stenosis of the
visualized portion of the aorta. Normal variant aortic arch
branching pattern with the left vertebral artery arising
independently from the aortic arch. The visualized proximal
subclavian arteries are widely patent.

RIGHT CAROTID SYSTEM: Normal without aneurysm, dissection or
stenosis.

LEFT CAROTID SYSTEM: Normal without aneurysm, dissection or
stenosis.

VERTEBRAL ARTERIES: Left dominant configuration. Both origins are
clearly patent. There is no dissection, occlusion or flow-limiting
stenosis to the skull base (V1-V3 segments).

CTA HEAD FINDINGS

POSTERIOR CIRCULATION:

--Vertebral arteries: Normal V4 segments.

--Inferior cerebellar arteries: Normal.

--Basilar artery: Normal.

--Superior cerebellar arteries: Normal.

--Posterior cerebral arteries (PCA): Normal.

ANTERIOR CIRCULATION:

--Intracranial internal carotid arteries: Normal.

--Anterior cerebral arteries (ACA): Normal. Both A1 segments are
present. Patent anterior communicating artery (a-comm).

--Middle cerebral arteries (MCA): Normal.

VENOUS SINUSES: As permitted by contrast timing, patent.

ANATOMIC VARIANTS: Fetal origin of the left posterior cerebral
artery.

Review of the MIP images confirms the above findings.
IMPRESSION: 1. Normal head CT.
2. ASPECTS is 10.
3. No emergent large vessel occlusion. Unremarkable CTA of the head
and neck.

These results were called by telephone at the time of interpretation
on 09/01/2020 at [DATE] to Dr. Maicao, who verbally acknowledged these
results.

## 2023-05-12 ENCOUNTER — Ambulatory Visit (INDEPENDENT_AMBULATORY_CARE_PROVIDER_SITE_OTHER): Payer: 59 | Admitting: Primary Care

## 2023-05-23 ENCOUNTER — Other Ambulatory Visit (INDEPENDENT_AMBULATORY_CARE_PROVIDER_SITE_OTHER): Payer: Self-pay | Admitting: Primary Care

## 2023-05-23 DIAGNOSIS — I10 Essential (primary) hypertension: Secondary | ICD-10-CM

## 2023-05-23 DIAGNOSIS — Z760 Encounter for issue of repeat prescription: Secondary | ICD-10-CM

## 2023-05-25 ENCOUNTER — Ambulatory Visit (INDEPENDENT_AMBULATORY_CARE_PROVIDER_SITE_OTHER): Payer: Self-pay | Admitting: Primary Care

## 2023-05-25 ENCOUNTER — Encounter (INDEPENDENT_AMBULATORY_CARE_PROVIDER_SITE_OTHER): Payer: Self-pay | Admitting: Primary Care

## 2023-05-25 VITALS — BP 144/80 | HR 99 | Resp 16 | Wt 170.2 lb

## 2023-05-25 DIAGNOSIS — I10 Essential (primary) hypertension: Secondary | ICD-10-CM | POA: Diagnosis not present

## 2023-05-25 DIAGNOSIS — E782 Mixed hyperlipidemia: Secondary | ICD-10-CM | POA: Diagnosis not present

## 2023-05-25 DIAGNOSIS — R7303 Prediabetes: Secondary | ICD-10-CM

## 2023-05-25 DIAGNOSIS — Z760 Encounter for issue of repeat prescription: Secondary | ICD-10-CM

## 2023-05-25 MED ORDER — HYDROCHLOROTHIAZIDE 25 MG PO TABS: 25.0000 mg | ORAL_TABLET | Freq: Every day | ORAL | 1 refills | Status: DC

## 2023-05-25 NOTE — Progress Notes (Unsigned)
 [facility]  Grandville [name], is a 63 y.o. male  [id number]  [id number]  DOB - Jun 15, 1960  Chief Complaint  Patient presents with   Hypertension   Hyperlipidemia       Subjective:   Nathaniel Hernandez is a 63 y.o. male here today for a follow up visit.  Management of hypertension.  Patient has No headache, No chest pain, No abdominal pain - No Nausea, No new weakness tingling or numbness, No Cough - shortness of breath.  He takes his blood pressure at home reading systolic 116- 134 diastolic and 71-84 .  Well-controlled Comprehensive ROS Pertinent positive and negative noted in HPI   No Known Allergies  Past Medical History:  Diagnosis Date   Hypertension     Current Outpatient Medications on File Prior to Visit  Medication Sig Dispense Refill   amLODipine  (NORVASC ) 10 MG tablet TAKE 1 TABLET BY MOUTH EVERY DAY 90 tablet 1   aspirin  EC 81 MG tablet Take 81 mg by mouth daily. Swallow whole.     atorvastatin  (LIPITOR ) 40 MG tablet Take 1 tablet (40 mg total) by mouth daily. 90 tablet 3   No current facility-administered medications on file prior to visit.   Health Maintenance  Topic Date Due   Zoster (Shingles) Vaccine (1 of 2) Never done   COVID-19 Vaccine (4 - 2024-25 season) 09/27/2022   Flu Shot  08/27/2023   Cologuard (Stool DNA test)  05/03/2025   DTaP/Tdap/Td vaccine (2 - Td or Tdap) 10/11/2030   Hepatitis C Screening  Completed   HIV Screening  Completed   HPV Vaccine  Aged Out   Meningitis B Vaccine  Aged Out    Objective:   Vitals:   05/25/23 1056 05/25/23 1057  BP: (!) 144/82 (!) 144/80  Pulse: 99   Resp: 16   SpO2: 97%   Weight: 170 lb 3.2 oz (77.2 kg)    BP Readings from Last 3 Encounters:  05/25/23 (!) 144/80  02/09/23 136/84  08/05/22 131/79      Physical Exam Vitals reviewed.  HENT:     Head: Normocephalic.     Right Ear: Tympanic membrane and external ear normal.     Left Ear: Tympanic membrane and external ear  normal.     Nose: Nose normal.  Eyes:     Extraocular Movements: Extraocular movements intact.     Pupils: Pupils are equal, round, and reactive to light.  Cardiovascular:     Rate and Rhythm: Normal rate and regular rhythm.  Pulmonary:     Effort: Pulmonary effort is normal.     Breath sounds: Normal breath sounds.  Abdominal:     General: Bowel sounds are normal. There is distension.     Palpations: Abdomen is soft.  Musculoskeletal:        General: Normal range of motion.     Cervical back: Normal range of motion.  Skin:    General: Skin is warm and dry.  Neurological:     Mental Status: He is oriented to person, place, and time.  Psychiatric:        Mood and Affect: Mood normal.        Behavior: Behavior normal.        Thought Content: Thought content normal.        Judgment: Judgment normal.       Assessment & Plan  Essential hypertension BP goal - < 130/80 Explained that having normal blood pressure is the goal and medications are  helping to get to goal and maintain normal blood pressure. DIET: Limit salt intake, read nutrition labels to check salt content, limit fried and high fatty foods  Avoid using multisymptom OTC cold preparations that generally contain sudafed which can rise BP. Consult with pharmacist on best cold relief products to use for persons with HTN EXERCISE Discussed incorporating exercise such as walking - 30 minutes most days of the week and can do in 10 minute intervals    -     CMP14+EGFR -     hydroCHLOROthiazide ; Take 1 tablet (25 mg total) by mouth daily.  Dispense: 90 tablet; Refill: 1  Mixed hyperlipidemia -     Lipid panel  Prediabetes -     Lipid panel -     CMP14+EGFR  Encounter for medication refill -     hydroCHLOROthiazide ; Take 1 tablet (25 mg total) by mouth daily.  Dispense: 90 tablet; Refill: 1     Patient have been counseled extensively about nutrition and exercise. Other issues discussed during this visit include: low  cholesterol diet, weight control and daily exercise, foot care, annual eye examinations at Ophthalmology, importance of adherence with medications and regular follow-up. We also discussed long term complications of uncontrolled diabetes and hypertension.   Return in about 3 months (around 08/24/2023) for fasting labs.  The patient was given clear instructions to go to ER or return to medical center if symptoms don't improve, worsen or new problems develop. The patient verbalized understanding. The patient was told to call to get lab results if they haven't heard anything in the next week.   This note has been created with Education officer, environmental. Any transcriptional errors are unintentional.   Marius Siemens, NP 05/25/2023, 11:53 AM

## 2023-05-26 ENCOUNTER — Other Ambulatory Visit (INDEPENDENT_AMBULATORY_CARE_PROVIDER_SITE_OTHER): Payer: Self-pay | Admitting: Primary Care

## 2023-05-26 DIAGNOSIS — E782 Mixed hyperlipidemia: Secondary | ICD-10-CM

## 2023-05-26 LAB — CMP14+EGFR
ALT: 37 IU/L (ref 0–44)
AST: 25 IU/L (ref 0–40)
Albumin: 4.5 g/dL (ref 3.9–4.9)
Alkaline Phosphatase: 110 IU/L (ref 44–121)
BUN/Creatinine Ratio: 16 (ref 10–24)
BUN: 12 mg/dL (ref 8–27)
Bilirubin Total: 0.5 mg/dL (ref 0.0–1.2)
CO2: 25 mmol/L (ref 20–29)
Calcium: 10 mg/dL (ref 8.6–10.2)
Chloride: 98 mmol/L (ref 96–106)
Creatinine, Ser: 0.77 mg/dL (ref 0.76–1.27)
Globulin, Total: 2.6 g/dL (ref 1.5–4.5)
Glucose: 120 mg/dL — ABNORMAL HIGH (ref 70–99)
Potassium: 3.9 mmol/L (ref 3.5–5.2)
Sodium: 138 mmol/L (ref 134–144)
Total Protein: 7.1 g/dL (ref 6.0–8.5)
eGFR: 102 mL/min/{1.73_m2} (ref >59)

## 2023-05-26 LAB — LIPID PANEL
Chol/HDL Ratio: 7.3 ratio — ABNORMAL HIGH (ref 0.0–5.0)
Cholesterol, Total: 308 mg/dL — ABNORMAL HIGH (ref 100–199)
HDL: 42 mg/dL (ref >39)
LDL Chol Calc (NIH): 145 mg/dL — ABNORMAL HIGH (ref 0–99)
Triglycerides: 628 mg/dL (ref 0–149)
VLDL Cholesterol Cal: 121 mg/dL — ABNORMAL HIGH (ref 5–40)

## 2023-05-26 MED ORDER — OMEGA-3-ACID ETHYL ESTERS 1 G PO CAPS: 2.0000 g | ORAL_CAPSULE | Freq: Two times a day (BID) | ORAL | 1 refills | Status: DC

## 2023-05-26 MED ORDER — FENOFIBRATE 145 MG PO TABS: 145.0000 mg | ORAL_TABLET | Freq: Every day | ORAL | 1 refills | Status: DC

## 2023-05-27 ENCOUNTER — Encounter (INDEPENDENT_AMBULATORY_CARE_PROVIDER_SITE_OTHER): Payer: Self-pay

## 2023-08-24 ENCOUNTER — Encounter (INDEPENDENT_AMBULATORY_CARE_PROVIDER_SITE_OTHER): Payer: Self-pay | Admitting: Primary Care

## 2023-08-24 ENCOUNTER — Ambulatory Visit (INDEPENDENT_AMBULATORY_CARE_PROVIDER_SITE_OTHER): Admitting: Primary Care

## 2023-08-24 VITALS — BP 138/72 | HR 73 | Resp 16 | Wt 160.2 lb

## 2023-08-24 DIAGNOSIS — Z2821 Immunization not carried out because of patient refusal: Secondary | ICD-10-CM | POA: Diagnosis not present

## 2023-08-24 DIAGNOSIS — I10 Essential (primary) hypertension: Secondary | ICD-10-CM | POA: Diagnosis not present

## 2023-08-24 DIAGNOSIS — Z8673 Personal history of transient ischemic attack (TIA), and cerebral infarction without residual deficits: Secondary | ICD-10-CM | POA: Diagnosis not present

## 2023-08-24 DIAGNOSIS — Z760 Encounter for issue of repeat prescription: Secondary | ICD-10-CM

## 2023-08-24 DIAGNOSIS — R7303 Prediabetes: Secondary | ICD-10-CM | POA: Diagnosis not present

## 2023-08-24 DIAGNOSIS — E782 Mixed hyperlipidemia: Secondary | ICD-10-CM

## 2023-08-24 DIAGNOSIS — G459 Transient cerebral ischemic attack, unspecified: Secondary | ICD-10-CM

## 2023-08-24 MED ORDER — AMLODIPINE BESYLATE 10 MG PO TABS: 10.0000 mg | ORAL_TABLET | Freq: Every day | ORAL | 1 refills | Status: DC

## 2023-08-24 MED ORDER — HYDROCHLOROTHIAZIDE 25 MG PO TABS: 25.0000 mg | ORAL_TABLET | Freq: Every day | ORAL | 1 refills | Status: DC

## 2023-08-24 NOTE — Progress Notes (Addendum)
 [facility]  Nathaniel Hernandez, is a 63 y.o. male  RDW:[id number]  FMW:[id number]  DOB - 1960-05-25  Chief Complaint  Patient presents with   Hypertension   Hyperlipidemia       Subjective:   Nathaniel Hernandez is a 63 y.o. male here today for a follow up visit for the management of hypertension.  Patient checks his blood pressure at home his readings are systolic 111-128 and diastolic 72-78 and heart rate ranges from 55-61.  Information was given after question elevated blood pressure systolic.  Patient admitted that I did not make him nervous or anxious.  Patient has No headache, No chest pain, No abdominal pain - No Nausea, No new weakness tingling or numbness, No Cough - shortness of breath   Hypertension This is a chronic problem. The current episode started more than 1 year ago. The problem has been gradually improving since onset. The problem is controlled. Risk factors for coronary artery disease include dyslipidemia and male gender. Past treatments include calcium  channel blockers and diuretics. The current treatment provides significant improvement. There are no compliance problems.   Hyperlipidemia   No problems updated.  Comprehensive ROS Pertinent positive and negative noted in HPI   No Known Allergies  Past Medical History:  Diagnosis Date   Hypertension     Current Outpatient Medications on File Prior to Visit  Medication Sig Dispense Refill   amLODipine  (NORVASC ) 10 MG tablet TAKE 1 TABLET BY MOUTH EVERY DAY 90 tablet 1   aspirin  EC 81 MG tablet Take 81 mg by mouth daily. Swallow whole.     atorvastatin  (LIPITOR ) 40 MG tablet Take 1 tablet (40 mg total) by mouth daily. 90 tablet 3   fenofibrate  (TRICOR ) 145 MG tablet Take 1 tablet (145 mg total) by mouth daily. 90 tablet 1   hydrochlorothiazide  (HYDRODIURIL ) 25 MG tablet Take 1 tablet (25 mg total) by mouth daily. 90 tablet 1   omega-3 acid ethyl esters (LOVAZA ) 1 g capsule Take 2 capsules (2 g  total) by mouth 2 (two) times daily. 180 capsule 1   No current facility-administered medications on file prior to visit.   Health Maintenance  Topic Date Due   COVID-19 Vaccine (4 - 2024-25 season) 09/27/2022   Zoster (Shingles) Vaccine (1 of 2) 11/24/2023*   Flu Shot  08/27/2023   Cologuard (Stool DNA test)  05/03/2025   DTaP/Tdap/Td vaccine (2 - Td or Tdap) 10/11/2030   Hepatitis C Screening  Completed   HIV Screening  Completed   Hepatitis B Vaccine  Aged Out   HPV Vaccine  Aged Out   Meningitis B Vaccine  Aged Out  *Topic was postponed. The date shown is not the original due date.    Objective:   Vitals:   08/24/23 0934 08/24/23 0935  BP: (!) 150/81 (!) 156/74  Pulse: 73   Resp: 16   SpO2: 98%   Weight: 160 lb 3.2 oz (72.7 kg)    BP Readings from Last 3 Encounters:  08/24/23 (!) 156/74  05/25/23 (!) 144/80  02/09/23 136/84   Physical Exam Vitals reviewed.  Constitutional:      Appearance: He is normal weight.  HENT:     Head: Normocephalic.     Right Ear: Tympanic membrane and external ear normal.     Left Ear: Tympanic membrane and external ear normal.     Nose: Nose normal.  Eyes:     Extraocular Movements: Extraocular movements intact.     Pupils: Pupils are  equal, round, and reactive to light.  Cardiovascular:     Rate and Rhythm: Normal rate and regular rhythm.     Pulses: Normal pulses.     Heart sounds: Normal heart sounds.  Pulmonary:     Effort: Pulmonary effort is normal.     Breath sounds: Normal breath sounds.  Abdominal:     General: Bowel sounds are normal. There is distension.     Palpations: Abdomen is soft.  Musculoskeletal:        General: Normal range of motion.  Skin:    General: Skin is warm and dry.  Neurological:     Mental Status: He is alert and oriented to person, place, and time.  Psychiatric:        Mood and Affect: Mood normal.        Behavior: Behavior normal.        Thought Content: Thought content normal.         Judgment: Judgment normal.     Assessment & Plan  Nathaniel Hernandez was seen today for hypertension and hyperlipidemia.  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 Consult with pharmacist on best cold relief products to use for persons with HTN EXERCISE Discussed incorporating exercise such as walking - 30 minutes most days of the week and can do in 10 minute intervals    -     CBC with Differential/Platelet -     CMP14+EGFR  Prediabetes  Healthy lifestyle diet of fruits vegetables fish nuts whole grains and low saturated fat . Foods high in cholesterol or liver, fatty meats,cheese, butter avocados, nuts and seeds, chocolate and fried foods. -     CBC with Differential/Platelet -     CMP14+EGFR -     Lipid panel -     Hemoglobin A1c  Herpes zoster vaccination declined Information on AVS  Encounter for medication refill -     hydrochlorothiazide  (HYDRODIURIL ) 25 MG tablet; Take 1 tablet (25 mg total) by mouth daily. -     amLODipine  (NORVASC ) 10 MG tablet; Take 1 tablet (10 mg total) by mouth daily.    Patient have been counseled extensively about nutrition and exercise. Other issues discussed during this visit include: low cholesterol diet, weight control and daily exercise, foot care, annual eye examinations at Ophthalmology, importance of adherence with medications and regular follow-up. We also discussed long term complications of uncontrolled diabetes and hypertension.   Return in about 6 months (around 02/24/2024) for fasting labs.  The patient was given  clear instructions to go to ER or return to medical center if symptoms don't improve, worsen or new problems develop. The patient verbalized understanding. The patient was told to call to get lab results if they haven't heard anything in the next week.   This note has been created with Education officer, environmental. Any transcriptional errors are unintentional.   Nathaniel SHAUNNA Bohr, NP 08/24/2023, 9:55 AM

## 2023-08-24 NOTE — Patient Instructions (Signed)
 Culebrilla  Shingles    La culebrilla, o herpes zster, es una infeccin. Produce una erupcin cutnea y ampollas. Estas zonas infectadas pueden doler mucho.  La culebrilla solo ocurre si:  Ha tenido varicela.  Le han aplicado una inyeccin llamada vacuna para protegerlo de la varicela. La culebrilla es poco frecuente en este caso.  Cules son las causas?  La culebrilla es causada por un germen llamado virus de la varicela zster. Este es el mismo germen que causa la varicela. Despus de estar expuesto al germen, este permanece en el cuerpo, pero est latente. Esto significa que no est activo.  La culebrilla se produce si el germen se vuelve activo nuevamente. Esto puede ocurrir aos despus de la primera exposicin al germen.  Qu incrementa el riesgo?  Es ms probable que tenga culebrilla si:  Es mayor de 60 aos.  Est bajo mucho estrs.  Tiene un sistema inmunitario dbil. El sistema inmunitario es el sistema de defensa de su cuerpo. Puede estar dbil si la persona:  Tiene el virus de inmunodeficiencia humana (VIH).  Tiene sndrome de inmunodeficiencia adquirida (sida).  Tiene cncer.  Toma medicamentos que debilitan el sistema inmunitario. Estos incluyen los medicamentos para el trasplante de rganos.  Cules son los signos o sntomas?  Los primeros sntomas de la culebrilla pueden ser picazn, hormigueo o Engineer, mining. Es posible que sienta que le arde la piel.  Unos das o semanas ms tarde, tendr una erupcin cutnea. Esto es lo que puede esperar:  Es probable que la erupcin aparezca en un solo lado del cuerpo.  La erupcin puede tener forma de cinturn o cinta. Con el tiempo, se convertir en ampollas llenas de lquido.  Las ampollas se rompern y se Forensic scientist.  Las costras se secarn en unas 2 o 3 semanas.  Tambin puede tener:  Lajune.  Escalofros.  Dolor de cabeza.  Nuseas.  Cmo se diagnostica?  La culebrilla se diagnostica con un examen de la piel. Se puede tomar una muestra llamada  cultivo de una de las ampollas y enviarla a un laboratorio. Esto indicar si tiene culebrilla.  Cmo se trata?  La erupcin cutnea puede durar varias semanas. No hay cura para la culebrilla, pero el mdico puede darle medicamentos. Estos medicamentos pueden hacer lo siguiente:  Ayudar a Engineer, materials.  Ayudar a la picazn.  Ayudar con la irritacin y la hinchazn.  Lograr una mejora ms pronto.  Prevenir problemas a largo plazo.  Si la erupcin est en la cara, es posible que deba consultar a un [place] o a un mdico especialista en nariz, garganta y odo (otorrinolaringlogo).  Siga estas instrucciones en su casa:  Medicamentos  Use los medicamentos solamente como se lo haya indicado el mdico.  Use una crema para calmar la picazn o cremas anestsicas en la erupcin cutnea o las ampollas segn las indicaciones del mdico.  Para aliviar la picazn y las molestias    Para ayudar a la picazn:  Coloque paos fros y hmedos, llamados compresas fras, sobre la erupcin cutnea o las ampollas.  Tome un bao fro. Pruebe agregar bicarbonato de sodio o avena seca en el agua. No se bae con agua caliente.  Use una locin de calamina en la erupcin cutnea o las ampollas. Puede conseguir este tipo de locin en la tienda.  Cuidado de las ampollas y la erupcin cutnea  Mantenga la zona de la erupcin cutnea cubierta con una venda floja.  Use ropa holgada que no roce contra  la erupcin.  Cudese la erupcin cutnea como se lo haya indicado el mdico. Asegrese de hacer lo siguiente:  Lvese las manos con agua y jabn durante al menos 20 segundos antes y despus de cambiarse la venda. Si no dispone de [place] y [place], use un desinfectante para manos.  Mantenga la erupcin cutnea y las ampollas limpias lavando la zona con jabn suave y agua fra.  Cmbiese la venda.  Verifique la erupcin cutnea todos los das para detectar signos de infeccin. Est atento a los siguientes signos:  Aumento del enrojecimiento, la  hinchazn o Chief Technology Officer.  Lquido o sangre.  Calor.  Pus o mal olor.  No se rasque el rea de la erupcin. No se toque las ampollas. Para evitar rascarse:  Tenga las uas siempre cortas y limpias.  Trate de usar guantes o [employer] duerme.  Instrucciones generales  Descanso.  Lvese las manos frecuentemente con agua y jabn durante al menos 20 segundos. Si no dispone de [place] y [place], use un desinfectante para manos. Lavarse las manos reduce la probabilidad de contraer una infeccin en la piel.  Su infeccin puede causar varicela en otras personas. Si tiene [employer] an no son costras, euel spangle de:  Los bebs.  Las [place].  Los nios con eczema.  Personas de edad avanzada que tienen un trasplante de rgano.  Las [employer] tienen una enfermedad de larga duracin, o Engineering geologist.  Las personas que no hayan tenido varicela antes.  Las personas que no hayan recibido la vacuna Transport planner.  Cmo se previene?  Las vacunas son la Geneticist, molecular de evitar que contraiga varicela o culebrilla. Hable con su mdico sobre la aplicacin de estas vacunas.  Dnde obtener ms informacin  Centers for Disease Control and Prevention Insurance claims handler) (Centros para el Control y la Prevencin de Event organiser): [email or web address].br  Comunquese con un mdico si:  El dolor no mejora con los medicamentos.  El dolor no mejora despus de que se cura la erupcin cutnea.  Tiene signos de infeccin alrededor de la erupcin cutnea.  La erupcin cutnea o las ampollas empeoran.  Tiene fiebre o escalofros.  Solicite ayuda de inmediato si:  La erupcin cutnea est en el rostro o en la nariz.  Tiene dolor en el rostro o cerca de los ojos.  Pierde la sensacin en un lado del rostro.  Tiene dificultad para ver.  Siente dolor o zumbido en el odo.  Esta informacin no tiene Theme park manager el consejo del mdico. Asegrese de hacerle al mdico cualquier pregunta que tenga.  Document Revised: 04/23/2022 Document Reviewed:  04/23/2022  Elsevier Patient Education  2024 [employer].

## 2023-08-25 LAB — CBC WITH DIFFERENTIAL/PLATELET
Basophils Absolute: 0.1 x10E3/uL (ref 0.0–0.2)
Basos: 1 %
EOS (ABSOLUTE): 0 x10E3/uL (ref 0.0–0.4)
Eos: 0 %
Hematocrit: 52.3 % — ABNORMAL HIGH (ref 37.5–51.0)
Hemoglobin: 16.9 g/dL (ref 13.0–17.7)
Immature Grans (Abs): 0 x10E3/uL (ref 0.0–0.1)
Immature Granulocytes: 0 %
Lymphocytes Absolute: 1.4 x10E3/uL (ref 0.7–3.1)
Lymphs: 26 %
MCH: 29.8 pg (ref 26.6–33.0)
MCHC: 32.3 g/dL (ref 31.5–35.7)
MCV: 92 fL (ref 79–97)
Monocytes Absolute: 0.5 x10E3/uL (ref 0.1–0.9)
Monocytes: 9 %
Neutrophils Absolute: 3.5 x10E3/uL (ref 1.4–7.0)
Neutrophils: 64 %
Platelets: 223 x10E3/uL (ref 150–450)
RBC: 5.68 x10E6/uL (ref 4.14–5.80)
RDW: 12.7 % (ref 11.6–15.4)
WBC: 5.5 x10E3/uL (ref 3.4–10.8)

## 2023-08-25 LAB — CMP14+EGFR
ALT: 43 IU/L (ref 0–44)
AST: 38 IU/L (ref 0–40)
Albumin: 4.7 g/dL (ref 3.9–4.9)
Alkaline Phosphatase: 64 IU/L (ref 44–121)
BUN/Creatinine Ratio: 24 (ref 10–24)
BUN: 21 mg/dL (ref 8–27)
Bilirubin Total: 0.7 mg/dL (ref 0.0–1.2)
CO2: 23 mmol/L (ref 20–29)
Calcium: 9.6 mg/dL (ref 8.6–10.2)
Chloride: 96 mmol/L (ref 96–106)
Creatinine, Ser: 0.87 mg/dL (ref 0.76–1.27)
Globulin, Total: 2.3 g/dL (ref 1.5–4.5)
Glucose: 99 mg/dL (ref 70–99)
Potassium: 3.7 mmol/L (ref 3.5–5.2)
Sodium: 136 mmol/L (ref 134–144)
Total Protein: 7 g/dL (ref 6.0–8.5)
eGFR: 98 mL/min/1.73 (ref >59)

## 2023-08-25 LAB — LIPID PANEL
Chol/HDL Ratio: 3 ratio (ref 0.0–5.0)
Cholesterol, Total: 181 mg/dL (ref 100–199)
HDL: 61 mg/dL (ref >39)
LDL Chol Calc (NIH): 106 mg/dL — ABNORMAL HIGH (ref 0–99)
Triglycerides: 77 mg/dL (ref 0–149)
VLDL Cholesterol Cal: 14 mg/dL (ref 5–40)

## 2023-08-25 LAB — HEMOGLOBIN A1C
Est. average glucose Bld gHb Est-mCnc: 111 mg/dL
Hgb A1c MFr Bld: 5.5 % (ref 4.8–5.6)

## 2023-08-30 ENCOUNTER — Ambulatory Visit (INDEPENDENT_AMBULATORY_CARE_PROVIDER_SITE_OTHER): Payer: Self-pay | Admitting: Primary Care

## 2023-08-30 DIAGNOSIS — E782 Mixed hyperlipidemia: Secondary | ICD-10-CM

## 2023-08-30 MED ORDER — FENOFIBRATE 145 MG PO TABS
145.0000 mg | ORAL_TABLET | Freq: Every day | ORAL | 1 refills | Status: DC
Start: 2023-08-30 — End: 2023-08-30

## 2023-08-30 MED ORDER — ATORVASTATIN CALCIUM 40 MG PO TABS
40.0000 mg | ORAL_TABLET | Freq: Every day | ORAL | 3 refills | Status: AC
Start: 2023-08-30 — End: ?

## 2023-10-08 ENCOUNTER — Encounter (HOSPITAL_BASED_OUTPATIENT_CLINIC_OR_DEPARTMENT_OTHER): Payer: Self-pay | Admitting: Internal Medicine

## 2023-10-08 ENCOUNTER — Ambulatory Visit (HOSPITAL_BASED_OUTPATIENT_CLINIC_OR_DEPARTMENT_OTHER): Admitting: Internal Medicine

## 2023-10-08 VITALS — BP 144/82 | HR 85 | Ht 68.0 in | Wt 169.1 lb

## 2023-10-08 DIAGNOSIS — I700 Atherosclerosis of aorta: Secondary | ICD-10-CM

## 2023-10-08 DIAGNOSIS — E785 Hyperlipidemia, unspecified: Secondary | ICD-10-CM

## 2023-10-08 DIAGNOSIS — Z8673 Personal history of transient ischemic attack (TIA), and cerebral infarction without residual deficits: Secondary | ICD-10-CM

## 2023-10-08 MED ORDER — OMEGA-3-ACID ETHYL ESTERS 1 G PO CAPS: 2.0000 g | ORAL_CAPSULE | Freq: Two times a day (BID) | ORAL | 3 refills | Status: AC

## 2023-10-08 NOTE — Progress Notes (Signed)
 LIPID CLINIC CONSULT NOTE  Chief Complaint:  Manage dyslipidemia  Primary Care Physician: Celestia Rosaline SQUIBB, NP  Primary Cardiologist:  None  HPI:  Nathaniel Hernandez is a 63 y.o. male who is being seen today for the evaluation of dyslipidemia at the request of Celestia Rosaline SQUIBB, NP.  This is a pleasant 63 year old male kindly referred for evaluation management of dyslipidemia.  He is Spanish-speaking but is accompanied by his son today who provided some translation.  He has a history of high cholesterol recently has been on atorvastatin  40 mg daily.  Lipids in July showed total cholesterol 181, HDL 61, triglycerides 77 and LDL 106.  He apparently has a history of TIA however workup was negative for stroke.  As part of that he underwent a transesophageal echo which showed some aortic atherosclerosis.  His target LDL should be less than 70.  In general he tries to eat a diet fairly low in saturated fats and cholesterol.  PMHx:  Past Medical History:  Diagnosis Date   Hypertension     Past Surgical History:  Procedure Laterality Date   BUBBLE STUDY  09/03/2020   Procedure: BUBBLE STUDY;  Surgeon: Pietro Redell RAMAN, MD;  Location: [facility] ENDOSCOPY;  Service: Cardiovascular;;   TEE WITHOUT CARDIOVERSION N/A 09/03/2020   Procedure: TRANSESOPHAGEAL ECHOCARDIOGRAM (TEE);  Surgeon: Pietro Redell RAMAN, MD;  Location[facility] ENDOSCOPY;  Service: Cardiovascular;  Laterality: N/A;    FAMHx:  Family History  Problem Relation Age of Onset   Stroke Father     SOCHx:   reports that he has never smoked. He has never used smokeless tobacco. He reports current alcohol use. He reports that he does not use drugs.  ALLERGIES:  No Known Allergies  ROS: Pertinent items noted in HPI and remainder of comprehensive ROS otherwise negative.  HOME MEDS: Current Outpatient Medications on File Prior to Visit  Medication Sig Dispense Refill   amLODipine  (NORVASC ) 10 MG tablet Take 1 tablet (10 mg total) by  mouth daily. 90 tablet 1   aspirin  EC 81 MG tablet Take 81 mg by mouth daily. Swallow whole.     atorvastatin  (LIPITOR ) 40 MG tablet Take 1 tablet (40 mg total) by mouth daily. 90 tablet 3   hydrochlorothiazide  (HYDRODIURIL ) 25 MG tablet Take 1 tablet (25 mg total) by mouth daily. 90 tablet 1   No current facility-administered medications on file prior to visit.    LABS/IMAGING: No results found for this or any previous visit (from the past 48 hours). No results found.  LIPID PANEL:    Component Value Date/Time   CHOL 181 08/24/2023 0931   TRIG 77 08/24/2023 0931   HDL 61 08/24/2023 0931   CHOLHDL 3.0 08/24/2023 0931   CHOLHDL 8.1 09/01/2020 0303   VLDL UNABLE TO CALCULATE IF TRIGLYCERIDE OVER 400 mg/dL 91/92/7977 9696   LDLCALC 106 (H) 08/24/2023 0931   LDLDIRECT 149.9 (H) 09/01/2020 0303    No results found for: LIPOA   WEIGHTS: Wt Readings from Last 3 Encounters:  10/08/23 169 lb 1.6 oz (76.7 kg)  08/24/23 160 lb 3.2 oz (72.7 kg)  05/25/23 170 lb 3.2 oz (77.2 kg)    VITALS: BP (!) 144/82   Pulse 85   Ht 5' 8 (1.727 m)   Wt 169 lb 1.6 oz (76.7 kg)   SpO2 97%   BMI 25.71 kg/m   EXAM: Deferred  EKG: Deferred  ASSESSMENT: Dyslipidemia, goal LDL less than 70 History of TIA Aortic atherosclerosis  PLAN:  1.   Nathaniel Hernandez has elevated cholesterol above target with LDL of 106.  His goal should be less than 70.  He will likely need additional therapies but I like to check an LP(a) first to see if that is elevated then I would likely recommend a PCSK9 inhibitor.  If not we could consider adding ezetimibe to his statin to reach target.  He is agreeable with this plan.  Thanks again for the kind referral.  [name], MD, [facility], FNLA, FACP  [place]  [facility]  Medical Director of the Advanced Lipid Disorders &  Cardiovascular Risk Reduction Clinic Diplomate of the American Board of Clinical Lipidology Attending Cardiologist  Direct Dial:  [phone]  Fax: [phone]  Website:  [email or web address] [name] 10/08/2023, 4:54 PM

## 2023-10-08 NOTE — Patient Instructions (Addendum)
 Medication Instructions:    Continue with current medications  Med refilled  *If you need a refill on your cardiac medications before your next appointment, please call your pharmacy*   Lab Work: Lpa If you have labs (blood work) drawn today and your tests are completely normal, you will receive your results only by: MyChart Message (if you have MyChart) OR A paper copy in the mail If you have any lab test that is abnormal or we need to change your treatment, we will call you to review the results.   Testing/Procedures:  Not needed  Follow-Up: At [facility], you and your health needs are our priority.  As part of our continuing mission to provide you with exceptional heart care, we have created designated Provider Care Teams.  These Care Teams include your primary Cardiologist (physician) and Advanced Practice Providers (APPs -  Physician Assistants and Nurse Practitioners) who all work together to provide you with the care you need, when you need it.     Your next appointment:   As needed -will contact you with results  The format for your next appointment:   In Person  Provider:   Dr Mona

## 2023-10-14 LAB — LIPOPROTEIN A (LPA): Lipoprotein (a): 191 nmol/L — ABNORMAL HIGH (ref <75.0)

## 2023-10-15 ENCOUNTER — Ambulatory Visit: Payer: Self-pay | Admitting: Internal Medicine

## 2023-10-15 DIAGNOSIS — E785 Hyperlipidemia, unspecified: Secondary | ICD-10-CM

## 2023-10-27 ENCOUNTER — Other Ambulatory Visit (HOSPITAL_COMMUNITY): Payer: Self-pay

## 2023-10-27 NOTE — Telephone Encounter (Signed)
 Pharmacy Patient Advocate Encounter   Received notification from Physician's Office that prior authorization for REPATHA is required/requested.   Insurance verification completed.   The patient is insured through [facility] MEDICAID.   Per test claim: PA required; PA submitted to above mentioned insurance via Latent Key/confirmation #/EOC [employer] Status is pending

## 2023-10-28 NOTE — Telephone Encounter (Signed)
 Pharmacy Patient Advocate Encounter  Received notification from [facility] MEDICAID that Prior Authorization for REPATHA has been DENIED.  Full denial letter will be uploaded to the media tab. See denial reason below. CRITERIA NOT MET PER PLAN

## 2023-11-04 MED ORDER — EZETIMIBE 10 MG PO TABS
10.0000 mg | ORAL_TABLET | Freq: Every day | ORAL | 3 refills | Status: AC
Start: 2023-11-04 — End: 2024-10-29

## 2023-11-04 NOTE — Telephone Encounter (Signed)
 Per denial patient has to try and fail zetia (see page 2 of denial)

## 2023-11-04 NOTE — Telephone Encounter (Signed)
-----   Message from [name] sent at 11/03/2023  9:22 AM EDT ----- Regarding: RE: repatha denied This should be appealed - according to the denial, he has been on atorvastatin  40 mg daily and had reduction in LDL from 145 to 106 over 3 months (April to June) - do we need to repeat a lipid now?  He is still above goal <70.  Not sure why this was denied.  Dr. Maxcy ----- Message ----- From: Loring Andriette HERO, RN Sent: 11/01/2023  10:24 AM EDT To: [name], MD Subject: repatha denied                                  ----- Message ----- From: Criss Fleeting, MADELYN Sent: 10/28/2023   9:10 AM EDT To: Andriette HERO Loring, RN  ----- Message from Fleeting Criss, [facility] sent at 10/28/2023  9:10 AM EDT -----

## 2024-01-10 LAB — LIPID PANEL
Chol/HDL Ratio: 2.2 ratio (ref 0.0–5.0)
Cholesterol, Total: 124 mg/dL (ref 100–199)
HDL: 57 mg/dL (ref >39)
LDL Chol Calc (NIH): 53 mg/dL (ref 0–99)
Triglycerides: 68 mg/dL (ref 0–149)
VLDL Cholesterol Cal: 14 mg/dL (ref 5–40)

## 2024-02-24 ENCOUNTER — Telehealth (INDEPENDENT_AMBULATORY_CARE_PROVIDER_SITE_OTHER): Payer: Self-pay | Admitting: Primary Care

## 2024-02-24 NOTE — Telephone Encounter (Signed)
 Spoke to pt about upcoming appt.. Will be present

## 2024-02-25 ENCOUNTER — Ambulatory Visit (INDEPENDENT_AMBULATORY_CARE_PROVIDER_SITE_OTHER): Payer: Self-pay | Admitting: Primary Care

## 2024-02-25 ENCOUNTER — Encounter (INDEPENDENT_AMBULATORY_CARE_PROVIDER_SITE_OTHER): Payer: Self-pay | Admitting: Primary Care

## 2024-02-25 ENCOUNTER — Other Ambulatory Visit: Payer: Self-pay

## 2024-02-25 VITALS — BP 140/78 | HR 72 | Temp 98.2°F | Resp 16 | Ht 68.0 in | Wt 170.0 lb

## 2024-02-25 DIAGNOSIS — Z760 Encounter for issue of repeat prescription: Secondary | ICD-10-CM

## 2024-02-25 DIAGNOSIS — R051 Acute cough: Secondary | ICD-10-CM

## 2024-02-25 DIAGNOSIS — I10 Essential (primary) hypertension: Secondary | ICD-10-CM

## 2024-02-25 MED ORDER — AMLODIPINE BESYLATE 10 MG PO TABS
10.0000 mg | ORAL_TABLET | Freq: Every day | ORAL | 1 refills | Status: AC
  Filled 2024-02-25: qty 90, 90d supply, fill #0

## 2024-02-25 MED ORDER — HYDROCHLOROTHIAZIDE 25 MG PO TABS: 25.0000 mg | ORAL_TABLET | Freq: Every day | ORAL | 1 refills | Status: DC

## 2024-02-25 MED ORDER — DM-GUAIFENESIN ER 30-600 MG PO TB12
1.0000 | ORAL_TABLET | Freq: Two times a day (BID) | ORAL | 0 refills | Status: AC
  Filled 2024-02-25: qty 30, 15d supply, fill #0

## 2024-02-25 MED ORDER — HYDROCHLOROTHIAZIDE 25 MG PO TABS
25.0000 mg | ORAL_TABLET | Freq: Every day | ORAL | 1 refills | Status: AC
  Filled 2024-02-25: qty 90, 90d supply, fill #0

## 2024-02-25 MED ORDER — AMLODIPINE BESYLATE 10 MG PO TABS: 10.0000 mg | ORAL_TABLET | Freq: Every day | ORAL | 1 refills | Status: DC

## 2024-02-25 MED ORDER — ALBUTEROL SULFATE HFA 108 (90 BASE) MCG/ACT IN AERS
2.0000 | INHALATION_SPRAY | Freq: Four times a day (QID) | RESPIRATORY_TRACT | 0 refills | Status: AC | PRN
  Filled 2024-02-25: qty 6.7, 25d supply, fill #0

## 2024-02-25 MED ORDER — ALBUTEROL SULFATE HFA 108 (90 BASE) MCG/ACT IN AERS: 2.0000 | INHALATION_SPRAY | Freq: Four times a day (QID) | RESPIRATORY_TRACT | 0 refills | Status: DC | PRN

## 2024-02-28 ENCOUNTER — Other Ambulatory Visit: Payer: Self-pay

## 2024-08-24 ENCOUNTER — Ambulatory Visit (INDEPENDENT_AMBULATORY_CARE_PROVIDER_SITE_OTHER): Payer: Self-pay | Admitting: Primary Care
